# Patient Record
Sex: Female | Born: 1990
Health system: Southern US, Community
[De-identification: ages and names within clinical notes are randomized; demographics above are authoritative.]

## PROBLEM LIST (undated history)

## (undated) DIAGNOSIS — B019 Varicella without complication: Secondary | ICD-10-CM

## (undated) DIAGNOSIS — F329 Major depressive disorder, single episode, unspecified: Secondary | ICD-10-CM

## (undated) DIAGNOSIS — F419 Anxiety disorder, unspecified: Secondary | ICD-10-CM

## (undated) DIAGNOSIS — I498 Other specified cardiac arrhythmias: Secondary | ICD-10-CM

## (undated) DIAGNOSIS — I951 Orthostatic hypotension: Principal | ICD-10-CM

## (undated) DIAGNOSIS — G90A Postural orthostatic tachycardia syndrome (POTS): Secondary | ICD-10-CM

## (undated) DIAGNOSIS — F32A Depression, unspecified: Secondary | ICD-10-CM

## (undated) DIAGNOSIS — R000 Tachycardia, unspecified: Principal | ICD-10-CM

## (undated) DIAGNOSIS — R55 Syncope and collapse: Secondary | ICD-10-CM

## (undated) DIAGNOSIS — I10 Essential (primary) hypertension: Secondary | ICD-10-CM

## (undated) DIAGNOSIS — T7840XA Allergy, unspecified, initial encounter: Secondary | ICD-10-CM

## (undated) HISTORY — DX: Postural orthostatic tachycardia syndrome (POTS): G90.A

## (undated) HISTORY — DX: Essential (primary) hypertension: I10

## (undated) HISTORY — DX: Allergy, unspecified, initial encounter: T78.40XA

## (undated) HISTORY — PX: NO PAST SURGERIES: SHX2092

## (undated) HISTORY — DX: Depression, unspecified: F32.A

## (undated) HISTORY — DX: Major depressive disorder, single episode, unspecified: F32.9

## (undated) HISTORY — DX: Orthostatic hypotension: I95.1

## (undated) HISTORY — DX: Syncope and collapse: R55

## (undated) HISTORY — DX: Tachycardia, unspecified: R00.0

## (undated) HISTORY — DX: Varicella without complication: B01.9

## (undated) HISTORY — DX: Anxiety disorder, unspecified: F41.9

## (undated) HISTORY — DX: Other specified cardiac arrhythmias: I49.8

---
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
## 2011-11-26 ENCOUNTER — Encounter: Payer: Self-pay | Admitting: Internal Medicine

## 2011-11-26 ENCOUNTER — Ambulatory Visit (INDEPENDENT_AMBULATORY_CARE_PROVIDER_SITE_OTHER): Payer: BC Managed Care – PPO | Admitting: Internal Medicine

## 2011-11-26 VITALS — BP 126/86 | HR 97 | Temp 98.2°F | Ht 63.0 in | Wt 283.5 lb

## 2011-11-26 DIAGNOSIS — F411 Generalized anxiety disorder: Secondary | ICD-10-CM

## 2011-11-26 DIAGNOSIS — E282 Polycystic ovarian syndrome: Secondary | ICD-10-CM

## 2011-11-26 DIAGNOSIS — E669 Obesity, unspecified: Secondary | ICD-10-CM

## 2011-11-26 DIAGNOSIS — F418 Other specified anxiety disorders: Secondary | ICD-10-CM

## 2011-11-26 MED ORDER — SERTRALINE HCL 50 MG PO TABS: 50.0000 mg | ORAL_TABLET | Freq: Every day | ORAL | Status: DC

## 2011-11-26 NOTE — Patient Instructions (Addendum)
I am starting you on generic zoloft to help your anxiety. Start with 1/2 tablet daily with dinner,  increase to full tablet after a week.   Please start taking folic acid 1 mg daily.   Return at your leisure for fasting labs ( no food or drink but water for 8 hours prior)  Goal is 20 minutes, 3 times weekly;  eventually 30 minutes 5 days a week.    This is  Dr. Norton Blizzard version of a  "Low GI"  Diet:  All of the foods can be found at grocery stores and in bulk at [employer].  The Atkins protein bars and shakes are available in more varieties at Target, [employer] and [employer].     7 AM Breakfast:  Low carbohydrate Protein  Shakes (I recommend the EAS AdvantEdge "Carb Control" shakes  Or the low carb shakes by Atkins.   Both are available everywhere:  In  cases at BJs  Or in 4 packs at grocery stores and pharmacies  2.5 carbs  (Alternative is  a toasted Arnold's Sandwhich Thin w/ peanut butter, a "Bagel Thin" with cream cheese and salmon) or  a scrambled egg burrito made with a low carb tortilla .  Avoid cereal and bananas, oatmeal too unless you are cooking the old fashioned kind that takes 30-40 minutes to prepare.  the rest is overly processed, has minimal fiber, and is loaded with carbohydrates!     10 AM: Protein bar by Atkins (the snack size, under 200 cal).  There are many varieties , available widely again or in bulk in limited varieties at BJs)  Other so called "protein bars" tend to be loaded with carbohydrates.  Remember, in food advertising, the word "energy" is synonymous for " carbohydrate."  Lunch: sandwich of [place], (or any lunchmeat, grilled meat or canned tuna), fresh avocado, mayonnaise  and cheese on a lower carbohydrate pita bread, flatbread, or tortilla . Ok to use regular mayonnaise. The bread is the only source or carbohydrate that can be decreased (Joseph's makes a pita bread and a flat bread that are 50 cal and 4 net carbs ; Toufayan makes a low carb flatbread that's 100  cal and 9 net carbs  and  Mission makes a low carb whole wheat tortilla  That is 210 cal and 6 net carbs)  3 PM:  Mid day :  Another protein bar,  Or a  cheese stick (100 cal, 0 carbs),  Or 1 ounce of  almonds, walnuts, pistachios, pecans, peanuts,  Macadamia nuts. Or a Dannon light n Fit greek yogurt, 80 cal 8 net carbs . Avoid "granola"; the dried cranberries and raisins are loaded with carbohydrates. Mixed nuts ok if no raisins or cranberries or dried fruit.      6 PM  Dinner:  "mean and green:"  Meat/chicken/fish or a high protein legume; , with a green salad, and a low GI  Veggie (broccoli, cauliflower, green beans, spinach, brussel sprouts. Lima beans) : Avoid "Low fat dressings, as well as Reyne Dumas and [address]! They are loaded with sugar! Instead use ranch, vinagrette,  Blue cheese, etc  9 PM snack : Breyer's "low carb" fudgsicle or  ice cream bar (Carb Smart line), or  Weight Watcher's ice cream bar , or another "no sugar added" ice cream;a serving of fresh berries/cherries with whipped cream (Avoid bananas, pineapple, grapes  and watermelon on a regular basis because they are high in sugar)   Remember that snack Substitutions  should be less than 15 to 20 carbs  Per serving. Remember to subtract fiber grams and sugar alcohols to get the "net carbs."

## 2011-11-27 ENCOUNTER — Encounter: Payer: Self-pay | Admitting: Internal Medicine

## 2011-11-27 DIAGNOSIS — E282 Polycystic ovarian syndrome: Secondary | ICD-10-CM | POA: Insufficient documentation

## 2011-11-27 DIAGNOSIS — E669 Obesity, unspecified: Secondary | ICD-10-CM | POA: Insufficient documentation

## 2011-11-27 DIAGNOSIS — F418 Other specified anxiety disorders: Secondary | ICD-10-CM | POA: Insufficient documentation

## 2011-11-27 NOTE — Assessment & Plan Note (Signed)
I have addressed  BMI and recommended a low glycemic index diet utilizing smaller more frequent meals to increase metabolism.  I have also recommended that patient start exercising with a goal of 30 minutes of aerobic exercise a minimum of 5 days per week. Screening for lipid disorders, thyroid and diabetes to be done today.   

## 2011-11-27 NOTE — Assessment & Plan Note (Signed)
Discussed trial of SSRI, Zoloft, generic, for initiation of therapy. She will return in one month.

## 2011-11-27 NOTE — Progress Notes (Signed)
Patient ID: Julie Hammond, female   DOB: [birth year], 21 y.o.   MRN: [id number] Patient Active Problem List  Diagnosis  . Obesity (BMI 30-39.9)  . Polycystic ovarian syndrome  . Anxiety with obsessional features    Subjective:  CC:   Chief Complaint  Patient presents with  . Establish Care    HPI:   Julie Hammond is a 21 y.o. female who presents as a new patient to establish primary care with the multiple complaints including Obesity with polycystic ovarian syndrome,  and anxiety accompanied by obsessive-compulsive behaviors.  She is a 21 year old white female referred by her mother for primary care. Patient recently relocated from [place] back to [place] and is living with her parents. She was diagnosed with polycystic ovarian syndrome by an primary care provider/gynecologist several years ago in [place]. She has not lost any weight or has been and has not been placed on medications except for birth control which has regulated her periods. She has been overweight her entire life. She has not made any formal attempts to lose weight. She does not exercise regularly. 2) The history of anxiety with obsessive-compulsive behaviors was brought up by her mother. Patient would not elaborate on her particular behaviors were treated them  but does agree that she has read about  OCD and prescriptions as her behaviors.     Past Medical History  Diagnosis Date  . Chicken pox   . Allergy     History reviewed. No pertinent past surgical history.  Family History  Problem Relation Age of Onset  . Obesity Mother   . Diabetes Father   . Obesity Father     History   Social History  . Marital Status: Single    Spouse Name: N/A    Number of Children: N/A  . Years of Education: N/A   Occupational History  . Not on file.   Social History Main Topics  . Smoking status: Never Smoker   . Smokeless tobacco: Never Used  . Alcohol Use: No  . Drug Use: No  . Sexually Active:  Not Currently    Birth Control/ Protection: Pill   Other Topics Concern  . Not on file   Social History Narrative  . No narrative on file         @ALLHX @    Review of Systems:   The remainder of the review of systems was negative except those addressed in the HPI.       Objective:  BP 126/86  Pulse 97  Temp 98.2 F (36.8 C)  Ht 5\' 3"  (1.6 m)  Wt 283 lb 8 oz (128.595 kg)  BMI 50.22 kg/m2  SpO2 99%  LMP 11/20/2011  General appearance: alert, obese. cooperative and appears stated age Neck: no adenopathy, no carotid bruit, supple, symmetrical, trachea midline and thyroid not enlarged, symmetric, no tenderness/mass/nodules Back: symmetric, no curvature. ROM normal. No CVA tenderness. Lungs: clear to auscultation bilaterally Heart: regular rate and rhythm, S1, S2 normal, no murmur, click, rub or gallop Abdomen: soft, non-tender; bowel sounds normal; no masses,  no organomegaly Pulses: 2+ and symmetric Skin: Skin color, texture, turgor normal. No rashes or lesions Lymph nodes: Cervical, supraclavicular, and axillary nodes normal.  Assessment and Plan:  Anxiety with obsessional features Discussed trial of SSRI, Zoloft, generic, for initiation of therapy. She will return in one month.  Polycystic ovarian syndrome Diagnosed by her former PCP. She is taking birth control for regulation of her periods. We discussed adding medication such as  metformin but would like to get some baseline blood work first. Low glycemic index diet given to patient and we'll and mother. Her entire family is overweight and all are interested in participating in his diet which I have recommended strongly for success.  Obesity (BMI 30-39.9) I have addressed  BMI and recommended a low glycemic index diet utilizing smaller more frequent meals to increase metabolism.  I have also recommended that patient start exercising with a goal of 30 minutes of aerobic exercise a minimum of 5 days per week.  Screening for lipid disorders, thyroid and diabetes to be done today.     Updated Medication List Outpatient Encounter Prescriptions as of 11/26/2011  Medication Sig Dispense Refill  . sertraline (ZOLOFT) 50 MG tablet Take 1 tablet (50 mg total) by mouth daily.  30 tablet  3     Orders Placed This Encounter  Procedures  . HM PAP SMEAR  . Lipid panel  . Comprehensive metabolic panel  . TSH    No Follow-up on file.

## 2011-11-27 NOTE — Assessment & Plan Note (Signed)
Diagnosed by her former PCP. She is taking birth control for regulation of her periods. We discussed adding medication such as metformin but would like to get some baseline blood work first. Low glycemic index diet given to patient and we'll and mother. Her entire family is overweight and all are interested in participating in his diet which I have recommended strongly for success.

## 2011-11-28 ENCOUNTER — Other Ambulatory Visit (INDEPENDENT_AMBULATORY_CARE_PROVIDER_SITE_OTHER): Payer: BC Managed Care – PPO

## 2011-11-28 DIAGNOSIS — E282 Polycystic ovarian syndrome: Secondary | ICD-10-CM

## 2011-11-28 LAB — COMPREHENSIVE METABOLIC PANEL
Albumin: 3.5 g/dL (ref 3.5–5.2)
Alkaline Phosphatase: 36 U/L — ABNORMAL LOW (ref 39–117)
BUN: 11 mg/dL (ref 6–23)
CO2: 24 mEq/L (ref 19–32)
Calcium: 8.7 mg/dL (ref 8.4–10.5)
Chloride: 105 mEq/L (ref 96–112)
GFR: 121.16 mL/min (ref >60.00)
Glucose, Bld: 91 mg/dL (ref 70–99)
Potassium: 3.6 mEq/L (ref 3.5–5.1)
Sodium: 137 mEq/L (ref 135–145)
Total Protein: 7.1 g/dL (ref 6.0–8.3)

## 2011-11-28 LAB — LIPID PANEL: Cholesterol: 144 mg/dL (ref 0–200)

## 2011-11-28 LAB — TSH: TSH: 2.22 u[IU]/mL (ref 0.35–5.50)

## 2011-11-29 ENCOUNTER — Encounter: Payer: Self-pay | Admitting: Internal Medicine

## 2012-01-01 ENCOUNTER — Ambulatory Visit (INDEPENDENT_AMBULATORY_CARE_PROVIDER_SITE_OTHER): Payer: BC Managed Care – PPO | Admitting: Internal Medicine

## 2012-01-01 ENCOUNTER — Encounter: Payer: Self-pay | Admitting: Internal Medicine

## 2012-01-01 VITALS — BP 126/78 | HR 97 | Temp 98.3°F | Ht 63.0 in | Wt 291.0 lb

## 2012-01-01 DIAGNOSIS — F418 Other specified anxiety disorders: Secondary | ICD-10-CM

## 2012-01-01 DIAGNOSIS — F411 Generalized anxiety disorder: Secondary | ICD-10-CM

## 2012-01-01 DIAGNOSIS — E669 Obesity, unspecified: Secondary | ICD-10-CM

## 2012-01-01 NOTE — Progress Notes (Signed)
Patient ID: Julie Hammond, female   DOB: [birth year], 21 y.o.   MRN: [id number]  Patient Active Problem List  Diagnosis  . Obesity (BMI 30-39.9)  . Polycystic ovarian syndrome  . Anxiety with obsessional features    Subjective:  CC:   No chief complaint on file.   HPI:   Edgardo Roys Feitchtingeris a 21 y.o. female who presents Follow up on anxiety with obsessional features.  One month ago he started a trial of Zoloft for management o symptoms patient returns today with improvement in symptoms. She states that she is concentrating better. She she notes an improvement in her obsessive-compulsive behaviors and is experiencing less anxiety . Her family has noticed an improvement as well. She did have mild nausea for the first week but this has resolved. She has no other complaints today.  Review vital signs is notable for a weight gain 3 pounds. She is not exercising or following a calorie or carbohydrate restricted diet .     Past Medical History  Diagnosis Date  . Chicken pox   . Allergy     History reviewed. No pertinent past surgical history.       The following portions of the patient's history were reviewed and updated as appropriate: Allergies, current medications, and problem list.    Review of Systems:   12 Pt  review of systems was negative except those addressed in the HPI,     History   Social History  . Marital Status: Single    Spouse Name: N/A    Number of Children: N/A  . Years of Education: N/A   Occupational History  . Not on file.   Social History Main Topics  . Smoking status: Never Smoker   . Smokeless tobacco: Never Used  . Alcohol Use: No  . Drug Use: No  . Sexually Active: Not Currently    Birth Control/ Protection: Pill   Other Topics Concern  . Not on file   Social History Narrative  . No narrative on file    Objective:  BP 126/78  Pulse 97  Temp 98.3 F (36.8 C)  Ht 5\' 3"  (1.6 m)  Wt 291 lb (131.997 kg)  BMI 51.55  kg/m2  SpO2 97%  LMP 11/25/2011  General appearance: alert, cooperative and appears stated age Ears: normal TM's and external ear canals both ears Throat: lips, mucosa, and tongue normal; teeth and gums normal Neck: no adenopathy, no carotid bruit, supple, symmetrical, trachea midline and thyroid not enlarged, symmetric, no tenderness/mass/nodules Back: symmetric, no curvature. ROM normal. No CVA tenderness. Lungs: clear to auscultation bilaterally Heart: regular rate and rhythm, S1, S2 normal, no murmur, click, rub or gallop Abdomen: soft, non-tender; bowel sounds normal; no masses,  no organomegaly Pulses: 2+ and symmetric Skin: Skin color, texture, turgor normal. No rashes or lesions Lymph nodes: Cervical, supraclavicular, and axillary nodes normal.  Assessment and Plan:  Anxiety with obsessional features Symptoms have improved since starting a trial of Zoloft. She would like to state the current dose of 50 mg daily. No changes today. Return in 3 months.  Obesity (BMI 30-39.9) Secondary to sedentary lifestyle and overeating. She has no signs of diabetes or thyroid disorder by recent labs. She has gained 3 pounds since she started Zoloft. I strongly recommended that she begin an exercise program which will help her anxiety as well. Low carbohydrate diet printout has been given to her.   Updated Medication List Outpatient Encounter Prescriptions as of 01/01/2012  Medication Sig Dispense  Refill  . sertraline (ZOLOFT) 50 MG tablet Take 1 tablet (50 mg total) by mouth daily.  30 tablet  3     No orders of the defined types were placed in this encounter.    No Follow-up on file.

## 2012-01-01 NOTE — Patient Instructions (Addendum)
Return in 3 months.   This is  Dr. Norton Blizzard version of a  "Low GI"  Diet:  All of the foods can be found at grocery stores and in bulk at [employer].  The Atkins protein bars and shakes are available in more varieties at Target, [employer] and [employer].     7 AM Breakfast:  Low carbohydrate Protein  Shakes (I recommend the EAS AdvantEdge "Carb Control" shakes  Or the low carb shakes by Atkins.   Both are available everywhere:  In  cases at [employer]Js  Or in 4 packs at grocery stores and pharmacies  2.5 carbs  (Alternative is  a toasted Arnold's Sandwhich Thin w/ peanut butter, a "Bagel Thin" with cream cheese and salmon) or  a scrambled egg burrito made with a low carb tortilla .  Avoid cereal and bananas, oatmeal too unless you are cooking the old fashioned kind that takes 30-40 minutes to prepare.  the rest is overly processed, has minimal fiber, and is loaded with carbohydrates!   10 AM: Protein bar by Atkins (the snack size, under 200 cal).  There are many varieties , available widely again or in bulk in limited varieties at [employer]Js)  Other so called "protein bars" tend to be loaded with carbohydrates.  Remember, in food advertising, the word "energy" is synonymous for " carbohydrate."  Lunch: sandwich of [place], (or any lunchmeat, grilled meat or canned tuna), fresh avocado, mayonnaise  and cheese on a lower carbohydrate pita bread, flatbread, or tortilla . Ok to use regular mayonnaise. The bread is the only source or carbohydrate that can be decreased (Joseph's makes a pita bread and a flat bread that are 50 cal and 4 net carbs ; Toufayan makes a low carb flatbread that's 100 cal and 9 net carbs  and  [employer]ission makes a low carb whole wheat tortilla  That is 210 cal and 6 net carbs)  3 PM:  Mid day :  Another protein bar,  Or a  cheese stick (100 cal, 0 carbs),  Or 1 ounce of  almonds, walnuts, pistachios, pecans, peanuts,  Macadamia nuts. Or a Dannon light n Fit greek yogurt, 80 cal 8 net carbs . Avoid  "granola"; the dried cranberries and raisins are loaded with carbohydrates. Mixed nuts ok if no raisins or cranberries or dried fruit.      6 PM  Dinner:  "mean and green:"  Meat/chicken/fish or a high protein legume; , with a green salad, and a low GI  Veggie (broccoli, cauliflower, green beans, spinach, brussel sprouts. Lima beans) : Avoid "Low fat dressings, as well as Reyne Dumas and [address]! They are loaded with sugar! Instead use ranch, vinagrette,  Blue cheese, etc  9 PM snack : Breyer's "low carb" fudgsicle or  ice cream bar (Carb Smart line), or  Weight Watcher's ice cream bar , or another "no sugar added" ice cream;a serving of fresh berries/cherries with whipped cream (Avoid bananas, pineapple, grapes  and watermelon on a regular basis because they are high in sugar)   Remember that snack Substitutions should be less than 15 to 20 carbs  Per serving. Remember to subtract fiber grams and sugar alcohols to get the "net carbs."

## 2012-01-04 ENCOUNTER — Encounter: Payer: Self-pay | Admitting: Internal Medicine

## 2012-01-04 NOTE — Assessment & Plan Note (Signed)
Symptoms have improved since starting a trial of Zoloft. She would like to state the current dose of 50 mg daily. No changes today. Return in 3 months.

## 2012-01-04 NOTE — Assessment & Plan Note (Signed)
Secondary to sedentary lifestyle and overeating. She has no signs of diabetes or thyroid disorder by recent labs. She has gained 3 pounds since she started Zoloft. I strongly recommended that she begin an exercise program which will help her anxiety as well. Low carbohydrate diet printout has been given to her.

## 2012-04-06 ENCOUNTER — Ambulatory Visit (INDEPENDENT_AMBULATORY_CARE_PROVIDER_SITE_OTHER): Payer: Managed Care, Other (non HMO) | Admitting: Internal Medicine

## 2012-04-06 ENCOUNTER — Encounter: Payer: Self-pay | Admitting: Internal Medicine

## 2012-04-06 VITALS — BP 126/86 | HR 100 | Temp 97.6°F | Resp 16 | Wt 295.2 lb

## 2012-04-06 DIAGNOSIS — F418 Other specified anxiety disorders: Secondary | ICD-10-CM

## 2012-04-06 DIAGNOSIS — F411 Generalized anxiety disorder: Secondary | ICD-10-CM

## 2012-04-06 DIAGNOSIS — E669 Obesity, unspecified: Secondary | ICD-10-CM

## 2012-04-06 MED ORDER — SERTRALINE HCL 50 MG PO TABS: 100.0000 mg | ORAL_TABLET | Freq: Every day | ORAL | Status: DC

## 2012-04-06 MED ORDER — SERTRALINE HCL 100 MG PO TABS: 100.0000 mg | ORAL_TABLET | Freq: Every day | ORAL | Status: DC

## 2012-04-06 NOTE — Patient Instructions (Addendum)
We are increasing your zoloft to 100 mg daily , you can use the 50's to take 75 mg daily for the first week if you notice nausea with the higher dose

## 2012-04-07 ENCOUNTER — Encounter: Payer: Self-pay | Admitting: Internal Medicine

## 2012-04-07 NOTE — Assessment & Plan Note (Signed)
I have addressed  BMI and recommended a low glycemic index diet utilizing smaller more frequent meals to increase metabolism.  I have also recommended that patient start exercising with a goal of 30 minutes of aerobic exercise a minimum of 5 days per week. Screening for lipid disorders, thyroid and diabetes to be done today.   

## 2012-04-07 NOTE — Assessment & Plan Note (Addendum)
Agree with increase in dose of zoloft to 100 mg daily .  She is introverted and avoids eye contact but likes to use testing and electronic media.  I have suggest that she use Mychart to let me know how the medication changes affect her symptoms,  Otherwise she will follow up in 3 months

## 2012-04-07 NOTE — Progress Notes (Signed)
Patient ID: Julie Hammond, female   DOB: [birth year], 21 y.o.   MRN: [id number]  Patient Active Problem List  Diagnosis  . Obesity (BMI 30-39.9)  . Polycystic ovarian syndrome  . Anxiety with obsessional features    Subjective:  CC:   Chief Complaint  Patient presents with  . Follow-up    HPI:   Julie Roys Feitchtingeris a 22 y.o. female who presents3 month follow up on generalized anxiety with obsessive compulsive traits.  Her symptoms improved initially on the starting dose of zoloft but she has noticed a trend toward reutrning to her old habits recently and is requesting an increase in her daily dose. She has no side effects from the medication, is sleeping well.Marland Kitchen   2) Obesity: her family has decided to start a  Walking program together along with a weight reduction dietary plan.she is motivated to follow through with the family's plan ,    Past Medical History  Diagnosis Date  . Chicken pox   . Allergy     History reviewed. No pertinent past surgical history.       The following portions of the patient's history were reviewed and updated as appropriate: Allergies, current medications, and problem list.    Review of Systems:  Patient denies headache, fevers, malaise, unintentional weight loss, skin rash, eye pain, sinus congestion and sinus pain, sore throat, dysphagia,  hemoptysis , cough, dyspnea, wheezing, chest pain, palpitations, orthopnea, edema, abdominal pain, nausea, melena, diarrhea, constipation, flank pain, dysuria, hematuria, urinary  Frequency, nocturia, numbness, tingling, seizures,  Focal weakness, Loss of consciousness,  Tremor, insomnia, depression, anxiety, and suicidal ideation.         History   Social History  . Marital Status: Single    Spouse Name: N/A    Number of Children: N/A  . Years of Education: N/A   Occupational History  . Not on file.   Social History Main Topics  . Smoking status: Never Smoker   . Smokeless tobacco:  Never Used  . Alcohol Use: No  . Drug Use: No  . Sexually Active: Not Currently    Birth Control/ Protection: Pill   Other Topics Concern  . Not on file   Social History Narrative  . No narrative on file    Objective:  BP 126/86  Pulse 100  Temp 97.6 F (36.4 C) (Oral)  Resp 16  Wt 295 lb 4 oz (133.925 kg)  SpO2 97%  LMP 03/06/2012  General appearance: morbidly obese, alert, cooperative and appears stated age Ears: normal TM's and external ear canals both ears Throat: lips, mucosa, and tongue normal; teeth and gums normal Neck: no adenopathy, no carotid bruit, supple, symmetrical, trachea midline and thyroid not enlarged, symmetric, no tenderness/mass/nodules Back: symmetric, no curvature. ROM normal. No CVA tenderness. Lungs: clear to auscultation bilaterally Heart: regular rate and rhythm, S1, S2 normal, no murmur, click, rub or gallop Abdomen: soft, non-tender; bowel sounds normal; no masses,  no organomegaly Pulses: 2+ and symmetric Skin: pustular acne, Lymph nodes: Cervical, supraclavicular, and axillary nodes normal.  Assessment and Plan:  Anxiety with obsessional features Agree with increase in dose of zoloft to 100 mg daily .  She is introverted and avoids eye contact but likes to use testing and electronic media.  I have suggest that she use Mychart to let me know how the medication changes affect her symptoms,  Otherwise she will follow up in 3 months   Obesity (BMI 30-39.9) I have addressed  BMI and recommended a  low glycemic index diet utilizing smaller more frequent meals to increase metabolism.  I have also recommended that patient start exercising with a goal of 30 minutes of aerobic exercise a minimum of 5 days per week. Screening for lipid disorders, thyroid and diabetes to be done today.     Updated Medication List Outpatient Encounter Prescriptions as of 04/06/2012  Medication Sig Dispense Refill  . sertraline (ZOLOFT) 100 MG tablet Take 1 tablet  (100 mg total) by mouth daily.  30 tablet  3  . [DISCONTINUED] sertraline (ZOLOFT) 50 MG tablet Take 1 tablet (50 mg total) by mouth daily.  30 tablet  3  . [DISCONTINUED] sertraline (ZOLOFT) 50 MG tablet Take 2 tablets (100 mg total) by mouth daily.  30 tablet  3     No orders of the defined types were placed in this encounter.    Return in about 3 months (around 07/05/2012).

## 2012-07-08 ENCOUNTER — Ambulatory Visit: Payer: Managed Care, Other (non HMO) | Admitting: Internal Medicine

## 2012-07-09 ENCOUNTER — Encounter: Payer: Self-pay | Admitting: Internal Medicine

## 2012-07-09 ENCOUNTER — Ambulatory Visit (INDEPENDENT_AMBULATORY_CARE_PROVIDER_SITE_OTHER): Payer: Managed Care, Other (non HMO) | Admitting: Internal Medicine

## 2012-07-09 VITALS — BP 110/80 | HR 98 | Temp 98.1°F | Resp 14 | Wt 299.5 lb

## 2012-07-09 DIAGNOSIS — F418 Other specified anxiety disorders: Secondary | ICD-10-CM

## 2012-07-09 DIAGNOSIS — E669 Obesity, unspecified: Secondary | ICD-10-CM

## 2012-07-09 DIAGNOSIS — B070 Plantar wart: Secondary | ICD-10-CM

## 2012-07-09 DIAGNOSIS — F411 Generalized anxiety disorder: Secondary | ICD-10-CM

## 2012-07-09 MED ORDER — IBUPROFEN 800 MG PO TABS: 800.0000 mg | ORAL_TABLET | Freq: Three times a day (TID) | ORAL | Status: DC | PRN

## 2012-07-09 MED ORDER — SERTRALINE HCL 100 MG PO TABS: 100.0000 mg | ORAL_TABLET | Freq: Every day | ORAL | Status: DC

## 2012-07-09 NOTE — Progress Notes (Signed)
Patient ID: Julie Hammond, female   DOB: 06-Jul-1990, 21 y.o.   MRN: [id number]  Patient Active Problem List   Diagnosis Date Noted  . Plantar warts 07/12/2012  . Obesity (BMI 30-39.9) 11/27/2011  . Polycystic ovarian syndrome 11/27/2011  . Anxiety with obsessional features 11/27/2011    Subjective:  CC:   Chief Complaint  Patient presents with  . Follow-up    3 month    HPI:   Julie Feitchtingeris a 22 y.o. female who presents Follow up on OCD and obesity.  3 months ago zoloft dose was increased for return of symptoms.  Havingi Increased  sleep but  She reports morning grogginess for less than 15 minutes.  She is averaging 6 to 9 hours  Per night.   2) Obesity:  BMI now 52,  Has actually gained 4 lbs ince last visit and 16 lbs total since sept .  At her last visit she was going to start a walking program with her family , but only followed through for a month before quitting.  No particular reason.  Denies joint pain.  3) Plantar warts on right foot 1str and second toes . 4 warts total  requesting treatment     Past Medical History  Diagnosis Date  . Chicken pox   . Allergy     History reviewed. No pertinent past surgical history.     The following portions of the patient's history were reviewed and updated as appropriate: Allergies, current medications, and problem list.    Review of Systems:   Patient denies headache, fevers, malaise, unintentional weight loss, skin rash, eye pain, sinus congestion and sinus pain, sore throat, dysphagia,  hemoptysis , cough, dyspnea, wheezing, chest pain, palpitations, orthopnea, edema, abdominal pain, nausea, melena, diarrhea, constipation, flank pain, dysuria, hematuria, urinary  Frequency, nocturia, numbness, tingling, seizures,  Focal weakness, Loss of consciousness,  Tremor, insomnia, depression, anxiety, and suicidal ideation.     History   Social History  . Marital Status: Single    Spouse Name: N/A    Number of  Children: N/A  . Years of Education: N/A   Occupational History  . Not on file.   Social History Main Topics  . Smoking status: Never Smoker   . Smokeless tobacco: Never Used  . Alcohol Use: No  . Drug Use: No  . Sexually Active: Not Currently    Birth Control/ Protection: Pill   Other Topics Concern  . Not on file   Social History Narrative  . No narrative on file    Objective:  BP 110/80  Pulse 98  Temp(Src) 98.1 F (36.7 C) (Oral)  Resp 14  Wt 299 lb 8 oz (135.852 kg)  BMI 53.07 kg/m2  SpO2 98%  LMP 07/05/2012  General appearance: alert, cooperative and appears stated age Ears: normal TM's and external ear canals both ears Throat: lips, mucosa, and tongue normal; teeth and gums normal Neck: no adenopathy, no carotid bruit, supple, symmetrical, trachea midline and thyroid not enlarged, symmetric, no tenderness/mass/nodules Back: symmetric, no curvature. ROM normal. No CVA tenderness. Lungs: clear to auscultation bilaterally Heart: regular rate and rhythm, S1, S2 normal, no murmur, click, rub or gallop Abdomen: soft, non-tender; bowel sounds normal; no masses,  no organomegaly Pulses: 2+ and symmetric Skin: Skin color, texture, turgor normal. No rashes or lesions Lymph nodes: Cervical, supraclavicular, and axillary nodes normal.  Assessment and Plan:  Obesity (BMI 30-39.9) I have addressed  BMI and recommended a low glycemic index diet utilizing smaller  more frequent meals to increase metabolism.  I have also recommended that patient start exercising with a goal of 30 minutes of aerobic exercise a minimum of 5 days per week.   Anxiety with obsessional features Controlled with current sertraline dose. No changes today   Plantar warts 4 warts frozen today per patienr request, one on great toe and three on 2nd toe.  Post proceudre care discussed,  Patient warned to expect blistering and redness.   Updated Medication List Outpatient Encounter Prescriptions as of  07/09/2012  Medication Sig Dispense Refill  . sertraline (ZOLOFT) 100 MG tablet Take 1 tablet (100 mg total) by mouth daily.  30 tablet  3  . [DISCONTINUED] sertraline (ZOLOFT) 100 MG tablet Take 1 tablet (100 mg total) by mouth daily.  30 tablet  3  . ibuprofen (ADVIL,MOTRIN) 800 MG tablet Take 1 tablet (800 mg total) by mouth every 8 (eight) hours as needed for pain.  30 tablet  0   No facility-administered encounter medications on file as of 07/09/2012.     No orders of the defined types were placed in this encounter.    Return in about 3 months (around 10/09/2012).

## 2012-07-09 NOTE — Patient Instructions (Addendum)
Ibuprofen 800 mg and tylenol 500 mg every 8 hours as needed for pain    I want you to lose 10%  Of your current body weight over the next 6 months . With diet and exercise (30 minutes of walking a minimum of 5 days per week)  This is  One version of a  "Low GI"  Diet:  It will still lower your blood sugars and allow you to lose 4 to 8  lbs  per month if you follow it carefully.  Your goal with exercise is a minimum of 30 minutes of aerobic exercise 5 days per week (Walking does not count once it becomes easy!)    All of the foods can be found at grocery stores and in bulk at [employer].  The Atkins protein bars and shakes are available in more varieties at Target, WalMart and [employer] Foods.     7 AM Breakfast:  Choose from the following:  Low carbohydrate Protein  Shakes (I recommend the EAS AdvantEdge "Carb Control" shakes  Or the low carb shakes by Atkins.    2.5 carbs   Arnold's "Sandwhich Thin"toasted  w/ peanut butter (no jelly: about 20 net carbs  "Bagel Thin" with cream cheese and salmon: about 20 carbs   a scrambled egg/bacon/cheese burrito made with Mission's "carb balance" whole wheat tortilla  (about 10 net carbs )   Avoid cereal and bananas, oatmeal and cream of wheat and grits. They are loaded with carbohydrates!   10 AM: high protein snack  Protein bar by Atkins (the snack size, under 200 cal, usually < 6 net carbs).    A stick of cheese:  Around 1 carb,  100 cal     Dannon Light n Fit [place] Yogurt  (80 cal, 8 carbs)  Other so called "protein bars" and Greek yogurts tend to be loaded with carbohydrates.  Remember, in food advertising, the word "energy" is synonymous for " carbohydrate."  Lunch:   A Sandwich using the bread choices listed, Can use any  Eggs,  lunchmeat, grilled meat or canned tuna), avocado, regular mayo/mustard  and cheese.  A Salad using blue cheese, ranch,  Goddess or vinagrette,  No croutons or "confetti" and no "candied nuts" but regular nuts OK.   No  pretzels or chips.  Pickles and miniature sweet peppers are a good low carb alternative that provide a "crunch"  The bread is the only source of carbohydrate in a sandwich and  can be decreased by trying some of these alternatives to traditional loaf bread  Joseph's makes a pita bread and a flat bread that are 50 cal and 4 net carbs available at BJs and WalMart.  This can be toasted to use with hummous as well  Toufayan makes a low carb flatbread that's 100 cal and 9 net carbs available at [employer] and [employer] makes 2 sizes of  Low carb whole wheat tortilla  (The large one is 210 cal and 6 net carbs) Avoid "Low fat dressings, as well as Reyne Dumas and [address] dressings They are loaded with sugar!   3 PM/ Mid day  Snack:  Consider  1 ounce of  almonds, walnuts, pistachios, pecans, peanuts,  Macadamia nuts or a nut medley.  Avoid "granola"; the dried cranberries and raisins are loaded with carbohydrates. Mixed nuts as long as there are no raisins,  cranberries or dried fruit.     6 PM  Dinner:     Meat/fowl/fish with  a green salad, and either broccoli, cauliflower, green beans, spinach, brussel sprouts or  Lima beans. DO NOT BREAD THE PROTEIN!!      There is a low carb pasta by Dreamfield's that is acceptable and tastes great: only 5 digestible carbs/serving.( All grocery stores but BJs carry it )  Try Kai Levins Angelo's chicken piccata or chicken or eggplant parm over low carb pasta.(Lowes and BJs)   Clifton Custard Sanchez's "Carnitas" (pulled pork, no sauce,  0 carbs) or his beef pot roast to make a dinner burrito (at BJ's)  Pesto over low carb pasta (bj's sells a good quality pesto in the center refrigerated section of the deli   Whole wheat pasta is still full of digestible carbs and  Not as low in glycemic index as Dreamfield's.   Brown rice is still rice,  So skip the rice and noodles if you eat [place] or [place] (or at least limit to 1/2 cup)  9 PM snack :   Breyer's "low carb" fudgsicle  or  ice cream bar (Carb Smart line), or  Weight Watcher's ice cream bar , or another "no sugar added" ice cream;  a serving of fresh berries/cherries with whipped cream   Cheese or DANNON'S LlGHT N FIT [place] YOGURT  Avoid bananas, pineapple, grapes  and watermelon on a regular basis because they are high in sugar.  THINK OF THEM AS DESSERT  Remember that snack Substitutions should be less than 10 NET carbs per serving and meals < 20 carbs. Remember to subtract fiber grams to get the "net carbs."

## 2012-07-12 ENCOUNTER — Encounter: Payer: Self-pay | Admitting: Internal Medicine

## 2012-07-12 DIAGNOSIS — B070 Plantar wart: Secondary | ICD-10-CM | POA: Insufficient documentation

## 2012-07-12 NOTE — Assessment & Plan Note (Signed)
Controlled with current sertraline dose. No changes today

## 2012-07-12 NOTE — Assessment & Plan Note (Signed)
I have addressed  BMI and recommended a low glycemic index diet utilizing smaller more frequent meals to increase metabolism.  I have also recommended that patient start exercising with a goal of 30 minutes of aerobic exercise a minimum of 5 days per week.  

## 2012-07-12 NOTE — Assessment & Plan Note (Signed)
4 warts frozen today per patienr request, one on great toe and three on 2nd toe.  Post proceudre care discussed,  Patient warned to expect blistering and redness.

## 2012-08-24 ENCOUNTER — Telehealth: Payer: Self-pay | Admitting: *Deleted

## 2012-08-24 DIAGNOSIS — Z8249 Family history of ischemic heart disease and other diseases of the circulatory system: Secondary | ICD-10-CM

## 2012-08-24 NOTE — Telephone Encounter (Signed)
Referral is in process as requested 

## 2012-08-24 NOTE — Telephone Encounter (Signed)
Patient sister has been diagnosed with Long QT syndrome and patient mother would like referral so patient can have cardiologist evaluate patient for same syndrome.

## 2012-08-25 NOTE — Telephone Encounter (Signed)
Patient notified

## 2012-08-27 ENCOUNTER — Encounter: Payer: Self-pay | Admitting: Cardiovascular Disease

## 2012-08-27 ENCOUNTER — Ambulatory Visit (INDEPENDENT_AMBULATORY_CARE_PROVIDER_SITE_OTHER): Payer: Managed Care, Other (non HMO) | Admitting: Cardiovascular Disease

## 2012-08-27 VITALS — BP 108/84 | HR 124 | Ht 64.0 in | Wt 297.0 lb

## 2012-08-27 DIAGNOSIS — I951 Orthostatic hypotension: Secondary | ICD-10-CM

## 2012-08-27 DIAGNOSIS — I498 Other specified cardiac arrhythmias: Secondary | ICD-10-CM | POA: Insufficient documentation

## 2012-08-27 DIAGNOSIS — R0602 Shortness of breath: Secondary | ICD-10-CM

## 2012-08-27 DIAGNOSIS — R55 Syncope and collapse: Secondary | ICD-10-CM | POA: Insufficient documentation

## 2012-08-27 DIAGNOSIS — R000 Tachycardia, unspecified: Secondary | ICD-10-CM

## 2012-08-27 DIAGNOSIS — R002 Palpitations: Secondary | ICD-10-CM

## 2012-08-27 DIAGNOSIS — Z8249 Family history of ischemic heart disease and other diseases of the circulatory system: Secondary | ICD-10-CM | POA: Insufficient documentation

## 2012-08-27 DIAGNOSIS — Z8679 Personal history of other diseases of the circulatory system: Secondary | ICD-10-CM

## 2012-08-27 NOTE — Assessment & Plan Note (Signed)
Notable drop in her blood pressure with accelerated heart rate on standing, heart rate up to 137 which is a 40 point increase in her heart rate with change in position. She was relatively asymptomatic. She has had episodes of syncope in the past. We have offered low-dose atenolol, encouraged fluid loading, even salt loading for blood pressure. Could also consider compression hose if she becomes symptomatic. He would like to hold off on medications at this time and discuss additional workup for possible prolonged QT with lebuer EP.  We'll hold off on any additional studies including echocardiogram or Holter at this time.

## 2012-08-27 NOTE — Assessment & Plan Note (Addendum)
Mother reports other daughter has prolonged QT, variable. By her report, this was difficult to induce and was done at [facility] under the guidance of EP. Require treadmill and hyperventilation to induce. He would like to schedule an appointment with EP to determine if [facility] has prolonged QT syndrome. Further testing could be done under the guidance of EP. We'll hold off on Holter and echo. EKG is essentially benign apart from tachycardia. Clinical exam is essentially benign apart from obesity. The mother and this patient will try to arrange appointment at the end of the month with Dr. Graciela Husbands.

## 2012-08-27 NOTE — Patient Instructions (Addendum)
You are doing well. No medication changes were made.  Please monitor your heart rate over the next few weeks at rest and with activity We will arrange follow up with Dr. Graciela Husbands this month  Please call us if you have new issues that need to be addressed before your next appt.

## 2012-08-27 NOTE — Progress Notes (Signed)
Patient ID: Julie Hammond, female    DOB: 03-26-1990, 22 y.o.   MRN: [id number]  HPI Comments: [name] is a 22 year old woman, patient of Dr. Darrick Huntsman who presents with her mother for evaluation of possible prolonged QT syndrome.  notes indicate a history of tachycardia and syncope.  The patient's mother reports that her other daughter was recently diagnosed with variable prolonged QT syndrome. This workup was extensive, done at [facility]. 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 He had difficult time increasing her prolonged QT and possible arrhythmia. By the mother's report, it sounds as if this was induced on a treadmill, and with hyperventilation.   The patient presenting today he has had episodes of syncope in the past. One episode was 4 years ago after she got up from a sitting position to a standing position and walking to the kitchen. She had syncope and woke up on the floor. She does not remember much 20 minutes prior to the event or 20 minutes after the event to this day.   Second episode of syncope occurred while she was visiting a family member in the hospital. She went to the bathroom in the hospital, came out of the bathroom and had syncope.   She's hours had low blood pressures, some problems with heart rates in the past. She has not been tracking this to any significant degree and does not check her blood pressure. Recently she has been asymptomatic, for obese many months.   Orthostatics done in the office today show blood pressure  supine 124/82, heart rate 97 Sitting 112/74, heart rate 93 Standing 109/72, heart rate 137 Followup standing blood pressures 108/80, heart rate 126 Repeat blood pressure standing 108/84, heart rate 124  EKG today shows normal sinus rhythm with rate 123 beats per minute, no significant ST or T wave changes, QTC 432 Additional EKGs were performed showing heart rate 120 with QTC 425 and QTC 403 with heart rate of  101    Outpatient Encounter Prescriptions as of 08/27/2012  Medication Sig Dispense Refill  . Multiple Vitamin (MULTIVITAMIN) tablet Take 1 tablet by mouth daily.      . sertraline (ZOLOFT) 100 MG tablet Take 1 tablet (100 mg total) by mouth daily.  30 tablet  3  . [DISCONTINUED] ibuprofen (ADVIL,MOTRIN) 800 MG tablet Take 1 tablet (800 mg total) by mouth every 8 (eight) hours as needed for pain.  30 tablet  0    Review of Systems  Constitutional: Negative.   HENT: Negative.   Eyes: Negative.   Respiratory: Negative.   Cardiovascular: Negative.   Gastrointestinal: Negative.   Musculoskeletal: Negative.   Skin: Negative.   Neurological: Negative.        Remote episode of syncope x2  Psychiatric/Behavioral: Negative.   All other systems reviewed and are negative.    BP 108/84  Pulse 124  Ht 5\' 4"  (1.626 m)  Wt 297 lb (134.718 kg)  BMI 50.95 kg/m2  Physical Exam  Nursing note and vitals reviewed. Constitutional: She is oriented to person, place, and time. She appears well-developed and well-nourished.  Obese  HENT:  Head: Normocephalic.  Nose: Nose normal.  Mouth/Throat: Oropharynx is clear and moist.  Eyes: Conjunctivae are normal. Pupils are equal, round, and reactive to light.  Neck: Normal range of motion. Neck supple. No JVD present.  Cardiovascular: Regular rhythm, S1 normal, S2 normal, normal heart sounds and intact distal pulses.  Tachycardia present.  Exam reveals no gallop and no  friction rub.   No murmur heard. Pulmonary/Chest: Effort normal and breath sounds normal. No respiratory distress. She has no wheezes. She has no rales. She exhibits no tenderness.  Abdominal: Soft. Bowel sounds are normal. She exhibits no distension. There is no tenderness.  Musculoskeletal: Normal range of motion. She exhibits no edema and no tenderness.  Lymphadenopathy:    She has no cervical adenopathy.  Neurological: She is alert and oriented to person, place, and time.  Coordination normal.  Skin: Skin is warm and dry. No rash noted. No erythema.  Psychiatric: She has a normal mood and affect. Her behavior is normal. Judgment and thought content normal.    Assessment and Plan

## 2012-08-27 NOTE — Assessment & Plan Note (Signed)
Episodes of syncope several years ago. Each seem to occur in the setting of Going from a sitting position to standing position which may be consistent with her drop in blood pressure and tachycardia seen today and possible orthostatic hypotension. Unable to exclude POTS.

## 2012-09-07 ENCOUNTER — Ambulatory Visit (INDEPENDENT_AMBULATORY_CARE_PROVIDER_SITE_OTHER): Payer: BC Managed Care – PPO | Admitting: Internal Medicine

## 2012-09-07 ENCOUNTER — Encounter: Payer: Self-pay | Admitting: Internal Medicine

## 2012-09-07 VITALS — BP 128/80 | HR 97 | Ht 63.0 in | Wt 301.5 lb

## 2012-09-07 DIAGNOSIS — R000 Tachycardia, unspecified: Secondary | ICD-10-CM

## 2012-09-07 DIAGNOSIS — I498 Other specified cardiac arrhythmias: Secondary | ICD-10-CM

## 2012-09-07 DIAGNOSIS — I951 Orthostatic hypotension: Secondary | ICD-10-CM

## 2012-09-07 DIAGNOSIS — R55 Syncope and collapse: Secondary | ICD-10-CM

## 2012-09-07 NOTE — Assessment & Plan Note (Signed)
The patient meets criteria for POTS, even with the expanded heart rate range associated with young women. Her symptoms also are very consistent. We discussed extensively the physiology and the importance of fluid repletion, trigger avoidance, and response to prodrome. I've given her and her mother the website information for POTS place.com and NeedCharge.es.  She and her mother both have similar symptoms. It begs the question as to whether there is a genetic disorder underlying this autonomic symptoms; this has been reported years ago.  I do not think the patient's symptoms relate to long QT syndrome. I think it would be important to undertake genetic testing if the probe was identified in the patient's sister.

## 2012-09-07 NOTE — Progress Notes (Signed)
ELECTROPHYSIOLOGY CONSULT NOTE  Patient ID: Julie Hammond, MRN: [id number], DOB/AGE: 1990-12-13 21 y.o. Admit date: (Not on file) Date of Consult: 09/07/2012  Primary Physician: Duncan Dull, MD Primary Cardiologist: TG  Chief Complaint: palpitations   HPI Julie Hammond is a 22 y.o. female  Seen with her mother after her sister was diagnosed with long QT syndrome by Dr. Rosiland Oz  The patient has a long-standing history of recurrent syncope with a stereotypical prodrome characterized by loss of vision, nausea, diaphoresis and flushing with recovery fatigue. Her syncopal episode occurred 4 years ago or so, one in the context of an illness and the other following a long drive to see her grandmother in the hospital  She has multiple episodes of presyncope with similar prodrome. She had lightheadedness with heat, standing, menses, shower, and she notes that her symptoms are worse in the morning. She has exercise intolerance and tachypalpitations. She is morbidly obese. She has had no peripheral edema  When she saw Dr. Knute Neu in the office 2 weeks ago, orthostatic vital signs were notable for a modest change in blood pressure, i.e. 124--108 ; with a significant change in heart rate from 93--137. Her diet is volume depleted.    Past Medical History  Diagnosis Date  . Chicken pox   . Allergy   . Syncope and collapse       Surgical History: History reviewed. No pertinent past surgical history.   Home Meds: Prior to Admission medications   Medication Sig Start Date End Date Taking? Authorizing Provider  Multiple Vitamin (MULTIVITAMIN) tablet Take 1 tablet by mouth daily.   Yes Historical Provider, MD  sertraline (ZOLOFT) 100 MG tablet Take 1 tablet (100 mg total) by mouth daily. 07/09/12  Yes Sherlene Shams, MD      Allergies: No Known Allergies  History   Social History  . Marital Status: Single    Spouse Name: N/A    Number of Children: N/A  . Years of Education:  N/A   Occupational History  . Not on file.   Social History Main Topics  . Smoking status: Never Smoker   . Smokeless tobacco: Never Used  . Alcohol Use: No  . Drug Use: No  . Sexually Active: Not Currently    Birth Control/ Protection: Pill   Other Topics Concern  . Not on file   Social History Narrative  . No narrative on file     Family History  Problem Relation Age of Onset  . Obesity Mother   . Diabetes Father   . Obesity Father      ROS:  Please see the history of present illness.     All other systems reviewed and negative.    Physical Exam: Blood pressure 128/80, pulse 97, height 5\' 3"  (1.6 m), weight 301 lb 8 oz (136.76 kg). General: Well developed, morbidly obese age appearing Caucasian  female in no acute distress. Head: Normocephalic, atraumatic, sclera non-icteric, no xanthomas, nares are without discharge. EENT: normal Lymph Nodes:  none Back: without scoliosis/kyphosis , no CVA tendersness Neck: Negative for carotid bruits. JVD not elevated. Lungs: Clear bilaterally to auscultation without wheezes, rales, or rhonchi. Breathing is unlabored. Heart: RRR with S1 S2. No*  murmur , rubs, or gallops appreciated. Abdomen: Soft, non-tender, non-distended with normoactive bowel sounds. No hepatomegaly. No rebound/guarding. No obvious abdominal masses. Msk:  Strength and tone appear normal for age. Extremities: No clubbing or cyanosis. No  edema.  Distal pedal pulses are 2+ and equal bilaterally.  Skin: Warm and Dry Neuro: Alert and oriented X 3. CN III-XII intact Grossly normal sensory and motor function . Psych:  Responds to questions appropriately with a normal affect.      Labs:  Radiology/Studies:  No results found.  EKG: sinus rhythm at 97 Intervals 14/09/33/QTC 40   Assessment and Plan:    Julie Hammond

## 2012-09-07 NOTE — Patient Instructions (Addendum)
Patient was given written Rx for Metoprolol Succinate 50 mg, Atenolol 50 mg, Bystolic 20 mg, Metoprolol Tartrate 50 mg to try to see which one will work best. Patient is to call office after trying medications and will let us know which works best.  Follow up with Dr. Graciela Husbands in August. Call office to schedule.

## 2012-10-06 ENCOUNTER — Telehealth: Payer: Self-pay

## 2012-10-06 MED ORDER — ATENOLOL 50 MG PO TABS: 50.0000 mg | ORAL_TABLET | Freq: Every day | ORAL | Status: DC

## 2012-10-06 NOTE — Telephone Encounter (Signed)
Needs Atenolol refill sent to [facility]

## 2012-10-06 NOTE — Telephone Encounter (Signed)
Spoke with pt and she mentioned that she preferred Atenolol 50 mg once daily when she was given written Rx to try by Dr. Graciela Husbands. Sent in refill for Atenolol 50 mg qd to [facility].

## 2012-10-09 ENCOUNTER — Ambulatory Visit: Payer: Managed Care, Other (non HMO) | Admitting: Internal Medicine

## 2012-10-12 ENCOUNTER — Telehealth: Payer: Self-pay

## 2012-10-12 NOTE — Telephone Encounter (Addendum)
Patient's mother called to report that her daughter called her on the phone to report that after sitting in a chair for about 30 minutes her feet turned "blueish in color" but when she got up and walked around the feet were normal in color. She was not witness to this but states that her daughter tends to cross her legs frequently and/or wrap her legs around the chair. The patient did not eat much on Saturday because she did not feel that well but oral intake was normal yesterday and today. Advised her to make sure that she stays well hydrated at all times and not to keep legs crossed and/or wrap them around a chair because it is not good for the circulation. She will let us know if this reoccurs. Patient's Mom is aware that Dr.Klein is out for 2 weeks but will let him know that she called.

## 2013-08-05 ENCOUNTER — Other Ambulatory Visit: Payer: Self-pay | Admitting: Internal Medicine

## 2013-10-21 ENCOUNTER — Encounter: Payer: Self-pay | Admitting: Internal Medicine

## 2013-10-21 ENCOUNTER — Ambulatory Visit (INDEPENDENT_AMBULATORY_CARE_PROVIDER_SITE_OTHER): Payer: 59 | Admitting: Internal Medicine

## 2013-10-21 VITALS — BP 132/72 | HR 84 | Temp 99.1°F | Resp 18 | Ht 63.0 in | Wt 292.8 lb

## 2013-10-21 DIAGNOSIS — E669 Obesity, unspecified: Secondary | ICD-10-CM

## 2013-10-21 DIAGNOSIS — E282 Polycystic ovarian syndrome: Secondary | ICD-10-CM

## 2013-10-21 DIAGNOSIS — R000 Tachycardia, unspecified: Principal | ICD-10-CM

## 2013-10-21 DIAGNOSIS — G90A Postural orthostatic tachycardia syndrome (POTS): Secondary | ICD-10-CM

## 2013-10-21 DIAGNOSIS — N920 Excessive and frequent menstruation with regular cycle: Secondary | ICD-10-CM

## 2013-10-21 DIAGNOSIS — E559 Vitamin D deficiency, unspecified: Secondary | ICD-10-CM

## 2013-10-21 DIAGNOSIS — N926 Irregular menstruation, unspecified: Secondary | ICD-10-CM

## 2013-10-21 DIAGNOSIS — N921 Excessive and frequent menstruation with irregular cycle: Secondary | ICD-10-CM

## 2013-10-21 DIAGNOSIS — I498 Other specified cardiac arrhythmias: Secondary | ICD-10-CM

## 2013-10-21 DIAGNOSIS — I951 Orthostatic hypotension: Principal | ICD-10-CM

## 2013-10-21 LAB — COMPREHENSIVE METABOLIC PANEL
ALK PHOS: 36 U/L — AB (ref 39–117)
ALT: 25 U/L (ref 0–35)
AST: 20 U/L (ref 0–37)
Albumin: 3.6 g/dL (ref 3.5–5.2)
BUN: 12 mg/dL (ref 6–23)
CALCIUM: 9.4 mg/dL (ref 8.4–10.5)
CHLORIDE: 107 meq/L (ref 96–112)
CO2: 26 mEq/L (ref 19–32)
Creatinine, Ser: 0.7 mg/dL (ref 0.4–1.2)
GFR: 118.97 mL/min (ref >60.00)
Glucose, Bld: 80 mg/dL (ref 70–99)
Potassium: 4.4 mEq/L (ref 3.5–5.1)
Sodium: 139 mEq/L (ref 135–145)
Total Bilirubin: 0.4 mg/dL (ref 0.2–1.2)
Total Protein: 6.7 g/dL (ref 6.0–8.3)

## 2013-10-21 LAB — VITAMIN D 25 HYDROXY (VIT D DEFICIENCY, FRACTURES): VITD: 10.22 ng/mL — ABNORMAL LOW (ref 30.00–100.00)

## 2013-10-21 LAB — HEMOGLOBIN A1C: HEMOGLOBIN A1C: 4.7 % (ref 4.6–6.5)

## 2013-10-21 MED ORDER — ATENOLOL 50 MG PO TABS: 50.0000 mg | ORAL_TABLET | Freq: Two times a day (BID) | ORAL | Status: DC

## 2013-10-21 MED ORDER — VENLAFAXINE HCL ER 150 MG PO TB24: 1.0000 | ORAL_TABLET | Freq: Every day | ORAL | Status: DC

## 2013-10-21 NOTE — Progress Notes (Signed)
Patient ID: Julie ShileyMikayla Hammond, female   DOB: [birth year], 23 y.o.   MRN: [id number]   Patient Active Problem List   Diagnosis Date Noted  . Unspecified vitamin D deficiency 10/23/2013  . POTS (postural orthostatic tachycardia syndrome) 08/27/2012  . Family history of cardiac disorder 08/27/2012  . Syncope 08/27/2012  . Plantar warts 07/12/2012  . Obesity (BMI 30-39.9) 11/27/2011  . Polycystic ovarian syndrome 11/27/2011  . Anxiety with obsessional features 11/27/2011    Subjective:  CC:   No chief complaint on file.   HPI:   Julie Hammond is a 23 y.o. female who presents for Follow up on chronic conditions including morbid obesity,PCOS,  generalized anxiety,  And POTS.    She was last  seen  By me in May 014.  She has lost 9 lbs but BMI still > 50.  She is not making a concerted effort to lose weight.   She is no longer taking zoloft. For GAD. She has been seeing a psychiatrist and therapy was changed to Pristiq by Dr. Lucianne MussLima but she can't afford to see her every week and the medication is costing her  $60/month   Periods are irregular  And occurring about every 3 months.  At times they are very heavy.  She is not sexually active.   POTs ;  Her atenolol wearing off early at once daily dosing and her tachycardia and light headedness are recurrent.    Past Medical History  Diagnosis Date  . Chicken pox   . Allergy   . Syncope and collapse     History reviewed. No pertinent past surgical history.     The following portions of the patient's history were reviewed and updated as appropriate: Allergies, current medications, and problem list.    Review of Systems:   Patient denies headache, fevers, malaise, unintentional weight loss, skin rash, eye pain, sinus congestion and sinus pain, sore throat, dysphagia,  hemoptysis , cough, dyspnea, wheezing, chest pain, palpitations, orthopnea, edema, abdominal pain, nausea, melena, diarrhea, constipation, flank pain,  dysuria, hematuria, urinary  Frequency, nocturia, numbness, tingling, seizures,  Focal weakness, Loss of consciousness,  Tremor, insomnia, depression, anxiety, and suicidal ideation.     History   Social History  . Marital Status: Single    Spouse Name: N/A    Number of Children: N/A  . Years of Education: N/A   Occupational History  . Not on file.   Social History Main Topics  . Smoking status: Never Smoker   . Smokeless tobacco: Never Used  . Alcohol Use: No  . Drug Use: No  . Sexual Activity: Not Currently    Birth Control/ Protection: Pill   Other Topics Concern  . Not on file   Social History Narrative  . No narrative on file    Objective:  Filed Vitals:   10/21/13 1104  BP: 132/72  Pulse: 84  Temp: 99.1 F (37.3 C)  Resp: 18     General appearance: alert, cooperative and appears stated age Ears: normal TM's and external ear canals both ears Throat: lips, mucosa, and tongue normal; teeth and gums normal Neck: no adenopathy, no carotid bruit, supple, symmetrical, trachea midline and thyroid not enlarged, symmetric, no tenderness/mass/nodules Back: symmetric, no curvature. ROM normal. No CVA tenderness. Lungs: clear to auscultation bilaterally Heart: regular rate and rhythm, S1, S2 normal, no murmur, click, rub or gallop Abdomen: soft, non-tender; bowel sounds normal; no masses,  no organomegaly Pulses: 2+ and symmetric Skin: Skin color, texture, turgor normal.  No rashes or lesions Lymph nodes: Cervical, supraclavicular, and axillary nodes normal.  Assessment and Plan:  Unspecified vitamin D deficiency Drisdol x 3 months , then 2000 units daily   Obesity (BMI 30-39.9) I have addressed  BMI and recommended wt loss of 12 lbs over the next 3 months using a low glycemic index diet and regular exercise a minimum of 5 days per week.   She has no signs of diabetes by fasting labs.  Thyroid and lipids  Are still pending.    POTS (postural orthostatic  tachycardia syndrome) diagnosed in June by Berton Mount.  Will increase the Atenolol to twice daily for recurrent symptoms.  Advised to increase noncaffeinated liquid intake    Updated Medication List Outpatient Encounter Prescriptions as of 10/21/2013  Medication Sig  . atenolol (TENORMIN) 50 MG tablet Take 1 tablet (50 mg total) by mouth 2 (two) times daily.  . ergocalciferol (DRISDOL) 50000 UNITS capsule Take 1 capsule (50,000 Units total) by mouth once a week. With food  . Multiple Vitamin (MULTIVITAMIN) tablet Take 1 tablet by mouth daily.  . Venlafaxine HCl 150 MG TB24 Take 1 tablet (150 mg total) by mouth daily.  . [DISCONTINUED] atenolol (TENORMIN) 50 MG tablet TAKE ONE TABLET BY MOUTH ONE TIME DAILY   . [DISCONTINUED] sertraline (ZOLOFT) 100 MG tablet Take 1 tablet (100 mg total) by mouth daily.     Orders Placed This Encounter  Procedures  . Hemoglobin A1c  . Comprehensive metabolic panel  . CBC with Differential  . Lipid panel  . TSH  . Vit D  25 hydroxy (rtn osteoporosis monitoring)    Return in about 3 months (around 01/21/2014).

## 2013-10-21 NOTE — Patient Instructions (Addendum)
We are changing your medication from pristic to generic effexor   The dose is One tablet daily in the morning   We are increasing the atenolol to two times daily for your POTS syndrome  I want you to start exercising a minimum of 3 days per week ,  30 minutes I want you breathing hard!!!!  No coasting!!  I want you to keep up the weight loss.  4 lbs a month is entirely achieivable using a low carb diet.  This is  One version of a  "Low GI"  Diet:  It will still lower your blood sugars and allow you to lose 4 to 8  lbs  per month if you follow it carefully.  Your goal with exercise is a minimum of 30 minutes of aerobic exercise 5 days per week (Walking does not count once it becomes easy!)    All of the foods can be found at grocery stores and in bulk at [employer].  The Atkins protein bars and shakes are available in more varieties at Target, WalMart and [employer]'s Foods.     7 AM Breakfast:  Choose from the following:  Low carbohydrate Protein  Shakes (I recommend the EAS AdvantEdge "Carb Control" shakes  Or the low carb shakes by Atkins.    2.5 carbs   Arnold's "Sandwhich Thin"toasted  w/ peanut butter (no jelly: about 20 net carbs  "Bagel Thin" with cream cheese and salmon: about 20 carbs   a scrambled egg/bacon/cheese burrito made with Mission's "carb balance" whole wheat tortilla  (about 10 net carbs )   Avoid cereal and bananas, oatmeal and cream of wheat and grits. They are loaded with carbohydrates!   10 AM: high protein snack  Protein bar by Atkins (the snack size, under 200 cal, usually < 6 net carbs).    A stick of cheese:  Around 1 carb,  100 cal     Dannon Light n Fit [place] Yogurt  (80 cal, 8 carbs)  Other so called "protein bars" and Greek yogurts tend to be loaded with carbohydrates.  Remember, in food advertising, the word "energy" is synonymous for " carbohydrate."  Lunch:   A Sandwich using the bread choices listed, Can use any  Eggs,  lunchmeat, grilled meat or canned  tuna), avocado, regular mayo/mustard  and cheese.  A Salad using blue cheese, ranch,  Goddess or vinagrette,  No croutons or "confetti" and no "candied nuts" but regular nuts OK.   No pretzels or chips.  Pickles and miniature sweet peppers are a good low carb alternative that provide a "crunch"  The bread is the only source of carbohydrate in a sandwich and  can be decreased by trying some of these alternatives to traditional loaf bread  Joseph's makes a pita bread and a flat bread that are 50 cal and 4 net carbs available at BJs and WalMart.  This can be toasted to use with hummous as well  Toufayan makes a low carb flatbread that's 100 cal and 9 net carbs available at [employer] and [employer]  Mission makes 2 sizes of  Low carb whole wheat tortilla  (The large one is 210 cal and 6 net carbs) Avoid "Low fat dressings, as well as Reyne DumasCatalina and [address] dressings They are loaded with sugar!   3 PM/ Mid day  Snack:  Consider  1 ounce of  almonds, walnuts, pistachios, pecans, peanuts,  Macadamia nuts or a nut medley.  Avoid "granola"; the dried  cranberries and raisins are loaded with carbohydrates. Mixed nuts as long as there are no raisins,  cranberries or dried fruit.    Try the prosciutto/mozzarella cheese sticks by Fiorruci  In deli /backery section   High protein      6 PM  Dinner:     Meat/fowl/fish with a green salad, and either broccoli, cauliflower, green beans, spinach, brussel sprouts or  Lima beans. DO NOT BREAD THE PROTEIN!!      There is a low carb pasta by Dreamfield's that is acceptable and tastes great: only 5 digestible carbs/serving.( All grocery stores but BJs carry it )  Try Kai Levins Angelo's chicken piccata or chicken or eggplant parm over low carb pasta.(Lowes and BJs)   Clifton Custard Sanchez's "Carnitas" (pulled pork, no sauce,  0 carbs) or his beef pot roast to make a dinner burrito (at BJ's)  Pesto over low carb pasta (bj's sells a good quality pesto in the center refrigerated  section of the deli   Try satueeing  Roosvelt Harps with mushroooms  Whole wheat pasta is still full of digestible carbs and  Not as low in glycemic index as Dreamfield's.   Brown rice is still rice,  So skip the rice and noodles if you eat [place] or [place] (or at least limit to 1/2 cup)  9 PM snack :   Breyer's "low carb" fudgsicle or  ice cream bar (Carb Smart line), or  Weight Watcher's ice cream bar , or another "no sugar added" ice cream;  a serving of fresh berries/cherries with whipped cream   Cheese or DANNON'S LlGHT N FIT GREEK YOGURT  8 ounces of [place] unsweetened almond/cococunut milk    Avoid bananas, pineapple, grapes  and watermelon on a regular basis because they are high in sugar.  THINK OF THEM AS DESSERT  Remember that snack Substitutions should be less than 10 NET carbs per serving and meals < 20 carbs. Remember to subtract fiber grams to get the "net carbs."

## 2013-10-21 NOTE — Progress Notes (Signed)
Pre visit review using our clinic review tool, if applicable. No additional management support is needed unless otherwise documented below in the visit note. 

## 2013-10-23 ENCOUNTER — Encounter: Payer: Self-pay | Admitting: Internal Medicine

## 2013-10-23 DIAGNOSIS — E559 Vitamin D deficiency, unspecified: Secondary | ICD-10-CM | POA: Insufficient documentation

## 2013-10-23 MED ORDER — ERGOCALCIFEROL 1.25 MG (50000 UT) PO CAPS: 50000.0000 [IU] | ORAL_CAPSULE | ORAL | Status: DC

## 2013-10-23 NOTE — Assessment & Plan Note (Signed)
diagnosed in June by Berton MountSteve Klein.  Will increase the Atenolol to twice daily for recurrent symptoms.  Advised to increase noncaffeinated liquid intake

## 2013-10-23 NOTE — Assessment & Plan Note (Signed)
Drisdol x 3 months , then 2000 units daily

## 2013-10-23 NOTE — Assessment & Plan Note (Signed)
I have addressed  BMI and recommended wt loss of 12 lbs over the next 3 months using a low glycemic index diet and regular exercise a minimum of 5 days per week.   She has no signs of diabetes by fasting labs.  Thyroid and lipids  Are still pending.

## 2013-10-25 ENCOUNTER — Encounter: Payer: Self-pay | Admitting: *Deleted

## 2013-10-27 ENCOUNTER — Encounter: Payer: Self-pay | Admitting: *Deleted

## 2014-01-21 ENCOUNTER — Other Ambulatory Visit: Payer: Self-pay | Admitting: Internal Medicine

## 2014-01-27 ENCOUNTER — Encounter: Payer: Self-pay | Admitting: Internal Medicine

## 2014-01-27 ENCOUNTER — Ambulatory Visit (INDEPENDENT_AMBULATORY_CARE_PROVIDER_SITE_OTHER): Payer: 59 | Admitting: Internal Medicine

## 2014-01-27 ENCOUNTER — Encounter (INDEPENDENT_AMBULATORY_CARE_PROVIDER_SITE_OTHER): Payer: Self-pay

## 2014-01-27 VITALS — BP 106/88 | HR 112 | Temp 98.9°F | Resp 18 | Ht 63.0 in | Wt 282.5 lb

## 2014-01-27 DIAGNOSIS — R000 Tachycardia, unspecified: Secondary | ICD-10-CM

## 2014-01-27 DIAGNOSIS — F419 Anxiety disorder, unspecified: Secondary | ICD-10-CM

## 2014-01-27 DIAGNOSIS — E669 Obesity, unspecified: Secondary | ICD-10-CM

## 2014-01-27 DIAGNOSIS — E559 Vitamin D deficiency, unspecified: Secondary | ICD-10-CM

## 2014-01-27 DIAGNOSIS — G90A Postural orthostatic tachycardia syndrome (POTS): Secondary | ICD-10-CM

## 2014-01-27 DIAGNOSIS — H9313 Tinnitus, bilateral: Secondary | ICD-10-CM

## 2014-01-27 DIAGNOSIS — I498 Other specified cardiac arrhythmias: Secondary | ICD-10-CM

## 2014-01-27 DIAGNOSIS — Z23 Encounter for immunization: Secondary | ICD-10-CM

## 2014-01-27 DIAGNOSIS — I951 Orthostatic hypotension: Secondary | ICD-10-CM

## 2014-01-27 DIAGNOSIS — F418 Other specified anxiety disorders: Secondary | ICD-10-CM

## 2014-01-27 MED ORDER — VENLAFAXINE HCL ER 75 MG PO CP24: 75.0000 mg | ORAL_CAPSULE | Freq: Every day | ORAL | Status: DC

## 2014-01-27 NOTE — Progress Notes (Signed)
Pre-visit discussion using our clinic review tool. No additional management support is needed unless otherwise documented below in the visit note.  

## 2014-01-27 NOTE — Progress Notes (Signed)
Patient ID: Julie Hammond, female   DOB: 1990/10/26, 23 y.o.   MRN: [id number]  Patient Active Problem List   Diagnosis Date Noted  . Tinnitus, subjective 01/30/2014  . Vitamin D deficiency 10/23/2013  . POTS (postural orthostatic tachycardia syndrome) 08/27/2012  . Family history of cardiac disorder 08/27/2012  . Syncope 08/27/2012  . Plantar warts 07/12/2012  . Obesity (BMI 30-39.9) 11/27/2011  . Polycystic ovarian syndrome 11/27/2011  . Anxiety with obsessional features 11/27/2011    Subjective:  CC:   Chief Complaint  Patient presents with  . Follow-up    general    HPI:   Julie RoysMikayla Hammond is a 23 y.o. female who presents for  Follow up on multiple issues including GAD,  Chronic orthostasis secondary to POTS syndrome and morbid obesity.    She was last seen in August,  At which time therapy for GAD  was changed.  Effexor started,  History:  zoloft changed to pristiq by Dr Lucianne MussLima, but patient could not afford to continue medication or follow up with psychiatrist.    POTS:  Atenolol dose was increased to bid at last visit for persistent tachycardia. She is tolerating the change in medication.  .   Obesity: wt loss last time acknowledged.  She  Has lost another 10 lbs for a total of 19 lbs since June BMi now 50 . 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 Chicken pox   . Allergy   . Syncope and collapse     No past surgical history on file.     The following portions of the patient's history were reviewed and updated as appropriate: Allergies, current medications, and problem list.    Review of Systems:   Patient denies headache, fevers, malaise, unintentional weight loss, skin rash, eye pain, sinus congestion and sinus pain, sore throat, dysphagia,  hemoptysis , cough, dyspnea, wheezing, chest pain, palpitations, orthopnea, edema, abdominal pain, nausea, melena,  diarrhea, constipation, flank pain, dysuria, hematuria, urinary  Frequency, nocturia, numbness, tingling, seizures,  Focal weakness, Loss of consciousness,  Tremor, insomnia, depression, anxiety, and suicidal ideation.     History   Social History  . Marital Status: Single    Spouse Name: N/A    Number of Children: N/A  . Years of Education: N/A   Occupational History  . Not on file.   Social History Main Topics  . Smoking status: Never Smoker   . Smokeless tobacco: Never Used  . Alcohol Use: No  . Drug Use: No  . Sexual Activity: Not Currently    Birth Control/ Protection: Pill   Other Topics Concern  . Not on file   Social History Narrative    Objective:  Filed Vitals:   01/27/14 1051  BP: 106/88  Pulse: 112  Temp: 98.9 F (37.2 C)  Resp: 18     General appearance: pale, alert, cooperative and appears stated age Ears: normal TM's and external ear canals both ears Throat: lips, mucosa, and tongue normal; teeth and gums normal Neck: no adenopathy, no carotid bruit, supple, symmetrical, trachea midline and thyroid not enlarged, symmetric, no tenderness/mass/nodules Back: symmetric, no curvature. ROM normal. No CVA tenderness. Lungs: clear to auscultation bilaterally Heart: regular rate and rhythm, S1, S2 normal, no murmur, click, rub or gallop Abdomen: soft, non-tender; bowel sounds normal; no masses,  no organomegaly Pulses: 2+ and symmetric Skin: Skin color, texture, turgor normal.  No rashes or lesions Lymph nodes: Cervical, supraclavicular, and axillary nodes normal. Psych: affect normal, makes good eye contact. No fidgeting,  Smiles easily.  Denies suicidal thoughts   Assessment and Plan:  Tinnitus, subjective Accompanied by mild discomfort.  TM's are normal appearing; trial of Flonase.  Advised to wear ear protection at work since there is occupational exposure to loud noise frequently.   Anxiety with obsessional features Tolerating effexor;  discussed  increasing the dose to 225 mg   Vitamin D deficiency Advised to increase dose to 2000 units daily for the next 3-4 months   POTS (postural orthostatic tachycardia syndrome) Increased dose today to 75 mg bid.   Obesity (BMI 30-39.9) I have congratulated her in reduction of   BMI to 50  and encouraged  Continued weight loss with goal of 10% of body weight over the next 6 months using a low glycemic index diet and regular exercise a minimum of 5 days per week.     Updated Medication List Outpatient Encounter Prescriptions as of 01/27/2014  Medication Sig  . atenolol (TENORMIN) 50 MG tablet Take 1.5 tablets (75 mg total) by mouth 2 (two) times daily.  . Cholecalciferol (VITAMIN D3) 1000 UNITS CAPS Take 1 capsule by mouth daily.  . Multiple Vitamin (MULTIVITAMIN) tablet Take 1 tablet by mouth daily.  Marland Kitchen. venlafaxine XR (EFFEXOR-XR) 150 MG 24 hr capsule TAKE ONE CAPSULE BY MOUTH ONE TIME DAILY   . [DISCONTINUED] atenolol (TENORMIN) 50 MG tablet Take 1 tablet (50 mg total) by mouth 2 (two) times daily.  . ergocalciferol (DRISDOL) 50000 UNITS capsule Take 1 capsule (50,000 Units total) by mouth once a week. With food  . venlafaxine XR (EFFEXOR-XR) 75 MG 24 hr capsule Take 1 capsule (75 mg total) by mouth daily with breakfast.     Orders Placed This Encounter  Procedures  . Td vaccine preservative free greater than or equal to 7yo IM    Return in about 3 months (around 04/29/2014).

## 2014-01-27 NOTE — Patient Instructions (Addendum)
Try flonase  Or nasocort 2 squirts on each side daily to see if it helps the ear pin whiy may be from sinus congestion  If not,  It's probably from the sound at the shop.  Increasing the effexor  to 225 mg daily using an additional 75 mg capsule  trial of increased atenolol to 75 mg twice daily (1.5 tablets twice daily)   Increase your vitamin D to 2000 units daily for the  winter months  Tinnitus Sounds you hear in your ears and coming from within the ear is called tinnitus. This can be a symptom of many ear disorders. It is often associated with hearing loss.  Tinnitus can be seen with:  Infections.  Ear blockages such as wax buildup.  Meniere's disease.  Ear damage.  Inherited.  Occupational causes. While irritating, it is not usually a threat to health. When the cause of the tinnitus is wax, infection in the middle ear, or foreign body it is easily treated. Hearing loss will usually be reversible.  TREATMENT  When treating the underlying cause does not get rid of tinnitus, it may be necessary to get rid of the unwanted sound by covering it up with more pleasant background noises. This may include music, the radio etc. There are tinnitus maskers which can be worn which produce background noise to cover up the tinnitus. Avoid all medications which tend to make tinnitus worse such as alcohol, caffeine, aspirin, and nicotine. There are many soothing background tapes such as rain, ocean, thunderstorms, etc. These soothing sounds help with sleeping or resting. Keep all follow-up appointments and referrals. This is important to identify the cause of the problem. It also helps avoid complications, impaired hearing, disability, or chronic pain. Document Released: 02/25/2005 Document Revised: 05/20/2011 Document Reviewed: 10/14/2007 [facility] Patient Information 2015 [place] [place]. This information is not intended to replace advice given to you by your health care provider. Make sure you  discuss any questions you have with your health care provider.

## 2014-01-30 DIAGNOSIS — H9319 Tinnitus, unspecified ear: Secondary | ICD-10-CM | POA: Insufficient documentation

## 2014-01-30 MED ORDER — ATENOLOL 50 MG PO TABS: 75.0000 mg | ORAL_TABLET | Freq: Two times a day (BID) | ORAL | Status: DC

## 2014-01-30 NOTE — Assessment & Plan Note (Signed)
Tolerating effexor;  discussed increasing the dose to 225 mg

## 2014-01-30 NOTE — Assessment & Plan Note (Signed)
Increased dose today to 75 mg bid.

## 2014-01-30 NOTE — Assessment & Plan Note (Signed)
I have congratulated her in reduction of   BMI to 50  and encouraged  Continued weight loss with goal of 10% of body weight over the next 6 months using a low glycemic index diet and regular exercise a minimum of 5 days per week.

## 2014-01-30 NOTE — Assessment & Plan Note (Signed)
Advised to increase dose to 2000 units daily for the next 3-4 months

## 2014-01-30 NOTE — Assessment & Plan Note (Signed)
Accompanied by mild discomfort.  TM's are normal appearing; trial of Flonase.  Advised to wear ear protection at work since there is occupational exposure to loud noise frequently.

## 2014-04-11 ENCOUNTER — Other Ambulatory Visit: Payer: Self-pay | Admitting: Internal Medicine

## 2014-04-29 ENCOUNTER — Ambulatory Visit: Payer: 59 | Admitting: Internal Medicine

## 2014-05-14 ENCOUNTER — Ambulatory Visit (INDEPENDENT_AMBULATORY_CARE_PROVIDER_SITE_OTHER): Payer: 59 | Admitting: Internal Medicine

## 2014-05-14 VITALS — BP 106/78 | HR 122 | Temp 98.4°F | Resp 20 | Ht 64.0 in | Wt 273.0 lb

## 2014-05-14 DIAGNOSIS — J029 Acute pharyngitis, unspecified: Secondary | ICD-10-CM | POA: Diagnosis not present

## 2014-05-14 MED ORDER — AMOXICILLIN 875 MG PO TABS: 875.0000 mg | ORAL_TABLET | Freq: Two times a day (BID) | ORAL | Status: DC

## 2014-05-14 NOTE — Progress Notes (Signed)
   Subjective:    Patient ID: Julie Hammond, female    DOB: May 17, 1990, 24 y.o.   MRN: [id number] This chart was scribed for Tonye Pearsonobert P Irbin Fines, MD by [place] Peace, ED Scribe. The patient was seen in [facility]. The patient's care was started at 11:44 AM.  HPI HPI Comments: Julie Hammond is a 24 y.o. female who [facility] of bilateral ear pain and sore throat. Mother confirms exposure with sick contact. Pt's brother is also suffering from same symptoms as well. No complaints of rhinorrhea or fever. Pt is non-smoker.  Patient sister positive for strep  Patient Active Problem List   Diagnosis Date Noted  . Tinnitus, subjective 01/30/2014  . Vitamin D deficiency 10/23/2013  . POTS (postural orthostatic tachycardia syndrome) 08/27/2012  . Family history of cardiac disorder 08/27/2012  . Syncope 08/27/2012  . Plantar warts 07/12/2012  . Obesity (BMI 30-39.9) 11/27/2011  . Polycystic ovarian syndrome 11/27/2011  . Anxiety with obsessional features 11/27/2011     Review of Systems  Constitutional: Negative for fever.  HENT: Positive for ear pain and sore throat. Negative for rhinorrhea.        Objective:   Physical Exam  HENT:  Mouth: Both tonsils are red and swollen with exudate.    tender anterior cervical nodes TMs clear Nares clear Lungs clear        Assessment & Plan:  , Pharyngitis likely secondary to strep  Amoxicillin 875 twice a day 10 days

## 2014-05-20 ENCOUNTER — Other Ambulatory Visit: Payer: Self-pay | Admitting: Internal Medicine

## 2014-05-25 ENCOUNTER — Ambulatory Visit (INDEPENDENT_AMBULATORY_CARE_PROVIDER_SITE_OTHER): Payer: 59 | Admitting: Internal Medicine

## 2014-05-25 ENCOUNTER — Encounter: Payer: Self-pay | Admitting: Internal Medicine

## 2014-05-25 VITALS — BP 112/78 | HR 85 | Temp 98.4°F | Resp 16 | Ht 64.0 in | Wt 276.8 lb

## 2014-05-25 DIAGNOSIS — H9313 Tinnitus, bilateral: Secondary | ICD-10-CM | POA: Diagnosis not present

## 2014-05-25 DIAGNOSIS — J029 Acute pharyngitis, unspecified: Secondary | ICD-10-CM

## 2014-05-25 DIAGNOSIS — F419 Anxiety disorder, unspecified: Secondary | ICD-10-CM | POA: Diagnosis not present

## 2014-05-25 DIAGNOSIS — G90A Postural orthostatic tachycardia syndrome (POTS): Secondary | ICD-10-CM

## 2014-05-25 DIAGNOSIS — I498 Other specified cardiac arrhythmias: Secondary | ICD-10-CM

## 2014-05-25 DIAGNOSIS — E669 Obesity, unspecified: Secondary | ICD-10-CM | POA: Diagnosis not present

## 2014-05-25 DIAGNOSIS — R000 Tachycardia, unspecified: Secondary | ICD-10-CM

## 2014-05-25 DIAGNOSIS — I951 Orthostatic hypotension: Secondary | ICD-10-CM

## 2014-05-25 DIAGNOSIS — F418 Other specified anxiety disorders: Secondary | ICD-10-CM

## 2014-05-25 MED ORDER — LAMOTRIGINE 25 MG PO TABS: 25.0000 mg | ORAL_TABLET | Freq: Every day | ORAL | Status: DC

## 2014-05-25 MED ORDER — VENLAFAXINE HCL ER 75 MG PO CP24: 225.0000 mg | ORAL_CAPSULE | Freq: Every day | ORAL | Status: DC

## 2014-05-25 NOTE — Patient Instructions (Signed)
Your throat looks fine,  The scratchy feeling may be due to post nasal drip , allergic rhinitis, or a viral syndrome ,  But is VERY UNLIKELY TO BE another case of strep thorat  Lavage your sinuses twice daily with NeilMed Sinus rinse .  Use benadryl 25 mg at bedtime   Gargle with salt water often for the sore throat.  If the throat is no better  In 3 to 4 days OR  if you develop T > 100.4,  Green nasal discharge,  Or facial pain,  Call  The office and let me know  I have added lamictal to help stabilize your mood.  Take it at bedtime.  The dose can be increased gradually , if needed,

## 2014-05-25 NOTE — Progress Notes (Signed)
Patient ID: Julie Hammond, female   DOB: 01-Mar-1991, 24 y.o.   MRN: [id number]  Patient Active Problem List   Diagnosis Date Noted  . Acute pharyngitis 05/28/2014  . Tinnitus, subjective 01/30/2014  . Vitamin D deficiency 10/23/2013  . POTS (postural orthostatic tachycardia syndrome) 08/27/2012  . Family history of cardiac disorder 08/27/2012  . Syncope 08/27/2012  . Plantar warts 07/12/2012  . Obesity (BMI 30-39.9) 11/27/2011  . Polycystic ovarian syndrome 11/27/2011  . Anxiety with obsessional features 11/27/2011    Subjective:  CC:   Chief Complaint  Patient presents with  . Follow-up    general follow up "tinnitus has pretty much gone away"    HPI:   Julie Hammond is a 24 y.o. female who presents for follow up on tinnitus,  Obesity, depression  and POTS.  She feels generally well except for the recurrence of a mild sore throat which started this morning accompanied by an unpleasant taste in her mouth.  She was treated for strep throat 2 weeks ago with amoxicillin for 10 days, which she finsised 3 days ago .  She denies fevers,  Cough, body aches and diarrhea.  Her POTS symptoms improved after increasing the dose of her beta blocker.  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   She has been having  depressive symptoms episodically, with increased social isolation and decresed motivation.  She denies suicial ideation , hopelessness and tearfulness.  Sleeping ok,  Not skipping classes.  No manic activity. Her anxiey with obsessive tendencies is controlled.    Past Medical History  Diagnosis Date  . Chicken pox   . Allergy   . Syncope and collapse   . Depression   . Anxiety     No past surgical history on file.     The following portions of the patient's history were reviewed and updated as appropriate: Allergies, current medications, and problem list.    Review of Systems:   Patient denies  headache, fevers, malaise, unintentional weight loss, skin rash, eye pain, sinus congestion and sinus pain, sore throat, dysphagia,  hemoptysis , cough, dyspnea, wheezing, chest pain, palpitations, orthopnea, edema, abdominal pain, nausea, melena, diarrhea, constipation, flank pain, dysuria, hematuria, urinary  Frequency, nocturia, numbness, tingling, seizures,  Focal weakness, Loss of consciousness,  Tremor, insomnia, depression, anxiety, and suicidal ideation.     History   Social History  . Marital Status: Single    Spouse Name: N/A  . Number of Children: N/A  . Years of Education: N/A   Occupational History  . Not on file.   Social History Main Topics  . Smoking status: Never Smoker   . Smokeless tobacco: Never Used  . Alcohol Use: No  . Drug Use: No  . Sexual Activity: Not Currently    Birth Control/ Protection: Pill   Other Topics Concern  . Not on file   Social History Narrative    Objective:  Filed Vitals:   05/25/14 1609  BP: 112/78  Pulse: 85  Temp: 98.4 F (36.9 C)  Resp: 16     General appearance: alert, cooperative and appears stated age Ears: normal TM's and external ear canals both ears Throat: lips, mucosa, and tongue normal; teeth and gums normal Neck: no adenopathy, no carotid bruit, supple, symmetrical, trachea midline and thyroid not enlarged, symmetric, no tenderness/mass/nodules Back: symmetric, no curvature. ROM normal. No CVA tenderness. Lungs: clear to auscultation bilaterally Heart: regular rate and rhythm, S1, S2 normal, no  murmur, click, rub or gallop Abdomen: soft, non-tender; bowel sounds normal; no masses,  no organomegaly Pulses: 2+ and symmetric Skin: Skin color, texture, turgor normal. No rashes or lesions Lymph nodes: Cervical, supraclavicular, and axillary nodes normal.  Assessment and Plan:  Tinnitus, subjective Attributed to environmental insults.  Improved with use of ear protectors.    POTS (postural orthostatic  tachycardia syndrome) She has been asymptomatic with increased beta blockade.   Anxiety with obsessional features She is taking 225 mg of effexor currently.  Now having some negative symptoms alternating with anxeity.  Discussed adding low dose  lamictal to help stabilize her moods   Obesity (BMI 30-39.9) I have addressed  BMI and recommended a low glycemic index diet utilizing smaller more frequent meals to increase metabolism.  I have also recommended that patient start exercising with a goal of 30 minutes of aerobic exercise a minimum of 5 days per week.   Wt Readings from Last 3 Encounters:  05/25/14 276 lb 12 oz (125.533 kg)  05/14/14 273 lb (123.832 kg)  01/27/14 282 lb 8 oz (128.141 kg)     Acute pharyngitis Her sore throat is most likely viral given the mild HEENT  Symptoms  And normal exam.   I have explained that it is very unlikley she would have recurrent strep given recent resoltion after amox and normal exam.  Resume benadryl for PND, add  tylenol 650 mq 8 hrs for aches and pains, return if symptoms worsen.     A total of  25 minutes of face to face time was spent with patient more than half of which was spent in counselling and coordination of care    Updated Medication List Outpatient Encounter Prescriptions as of 05/25/2014  Medication Sig  . atenolol (TENORMIN) 50 MG tablet TAKE ONE AND ONE-HALF TABLETS BY MOUTH TWICE DAILY   . Cholecalciferol (VITAMIN D3) 1000 UNITS CAPS Take 1 capsule by mouth daily.  . Multiple Vitamin (MULTIVITAMIN) tablet Take 1 tablet by mouth daily.  Marland Kitchen venlafaxine XR (EFFEXOR-XR) 75 MG 24 hr capsule Take 3 capsules (225 mg total) by mouth daily with breakfast.  . [DISCONTINUED] venlafaxine XR (EFFEXOR-XR) 150 MG 24 hr capsule TAKE ONE CAPSULE BY MOUTH ONE TIME DAILY   . [DISCONTINUED] venlafaxine XR (EFFEXOR-XR) 75 MG 24 hr capsule Take 1 capsule (75 mg total) by mouth daily with breakfast.  . lamoTRIgine (LAMICTAL) 25 MG tablet Take 1 tablet  (25 mg total) by mouth daily.  . [DISCONTINUED] amoxicillin (AMOXIL) 875 MG tablet Take 1 tablet (875 mg total) by mouth 2 (two) times daily.  . [DISCONTINUED] ergocalciferol (DRISDOL) 50000 UNITS capsule Take 1 capsule (50,000 Units total) by mouth once a week. With food     No orders of the defined types were placed in this encounter.    No Follow-up on file.

## 2014-05-28 DIAGNOSIS — J029 Acute pharyngitis, unspecified: Secondary | ICD-10-CM | POA: Insufficient documentation

## 2014-05-28 NOTE — Assessment & Plan Note (Signed)
Attributed to environmental insults.  Improved with use of ear protectors.

## 2014-05-28 NOTE — Assessment & Plan Note (Signed)
She is taking 225 mg of effexor currently.  Now having some negative symptoms alternating with anxeity.  Discussed adding low dose  lamictal to help stabilize her moods

## 2014-05-28 NOTE — Assessment & Plan Note (Signed)
She has been asymptomatic with increased beta blockade.

## 2014-05-28 NOTE — Assessment & Plan Note (Signed)
I have addressed  BMI and recommended a low glycemic index diet utilizing smaller more frequent meals to increase metabolism.  I have also recommended that patient start exercising with a goal of 30 minutes of aerobic exercise a minimum of 5 days per week.   Wt Readings from Last 3 Encounters:  05/25/14 276 lb 12 oz (125.533 kg)  05/14/14 273 lb (123.832 kg)  01/27/14 282 lb 8 oz (128.141 kg)

## 2014-05-28 NOTE — Assessment & Plan Note (Signed)
Her sore throat is most likely viral given the mild HEENT  Symptoms  And normal exam.   I have explained that it is very unlikley she would have recurrent strep given recent resoltion after amox and normal exam.  Resume benadryl for PND, add  tylenol 650 mq 8 hrs for aches and pains, return if symptoms worsen.

## 2014-08-26 ENCOUNTER — Ambulatory Visit (INDEPENDENT_AMBULATORY_CARE_PROVIDER_SITE_OTHER): Payer: 59 | Admitting: Internal Medicine

## 2014-08-26 ENCOUNTER — Encounter: Payer: Self-pay | Admitting: Internal Medicine

## 2014-08-26 VITALS — BP 128/78 | HR 111 | Temp 98.6°F | Resp 14 | Ht 64.0 in | Wt 264.8 lb

## 2014-08-26 DIAGNOSIS — Z79899 Other long term (current) drug therapy: Secondary | ICD-10-CM | POA: Diagnosis not present

## 2014-08-26 DIAGNOSIS — F419 Anxiety disorder, unspecified: Secondary | ICD-10-CM

## 2014-08-26 DIAGNOSIS — F418 Other specified anxiety disorders: Secondary | ICD-10-CM

## 2014-08-26 DIAGNOSIS — R300 Dysuria: Secondary | ICD-10-CM

## 2014-08-26 DIAGNOSIS — N3090 Cystitis, unspecified without hematuria: Secondary | ICD-10-CM | POA: Diagnosis not present

## 2014-08-26 LAB — POCT URINALYSIS DIPSTICK
Bilirubin, UA: NEGATIVE
Glucose, UA: NEGATIVE
Ketones, UA: NEGATIVE
NITRITE UA: NEGATIVE
PH UA: 5.5
PROTEIN UA: 30
Spec Grav, UA: 1.025
Urobilinogen, UA: 0.2

## 2014-08-26 MED ORDER — SULFAMETHOXAZOLE-TRIMETHOPRIM 800-160 MG PO TABS: 1.0000 | ORAL_TABLET | Freq: Two times a day (BID) | ORAL | Status: DC

## 2014-08-26 MED ORDER — LAMOTRIGINE 25 MG PO TABS: 50.0000 mg | ORAL_TABLET | Freq: Every day | ORAL | Status: DC

## 2014-08-26 NOTE — Progress Notes (Signed)
Subjective:  Patient ID: Julie Hammond, female    DOB: 1990/03/25  Age: 24 y.o. MRN: [id number]  CC: The primary encounter diagnosis was Cystitis. Diagnoses of Dysuria, Encounter for long-term (current) use of high-risk medication, and Anxiety with obsessional features were also pertinent to this visit.  HPI Julie Hammond presents for follow up on depression with GAD and new onset dysuria. She was prescribed effexor xr and lamictal was added 3 months ago due to recurrence of anxiety and obsessive features .  She notes minor improvement in the anxiety but not  the depression   Dysuria x 3 days   Outpatient Prescriptions Prior to Visit  Medication Sig Dispense Refill  . atenolol (TENORMIN) 50 MG tablet TAKE ONE AND ONE-HALF TABLETS BY MOUTH TWICE DAILY  90 tablet 5  . Cholecalciferol (VITAMIN D3) 1000 UNITS CAPS Take 1 capsule by mouth daily.    . Multiple Vitamin (MULTIVITAMIN) tablet Take 1 tablet by mouth daily.    Marland Kitchen venlafaxine XR (EFFEXOR-XR) 75 MG 24 hr capsule Take 3 capsules (225 mg total) by mouth daily with breakfast. 90 capsule 1  . lamoTRIgine (LAMICTAL) 25 MG tablet Take 1 tablet (25 mg total) by mouth daily. 30 tablet 5   No facility-administered medications prior to visit.    Review of Systems;  Patient denies headache, fevers, malaise, unintentional weight loss, skin rash, eye pain, sinus congestion and sinus pain, sore throat, dysphagia,  hemoptysis , cough, dyspnea, wheezing, chest pain, palpitations, orthopnea, edema, abdominal pain, nausea, melena, diarrhea, constipation, flank pain, dysuria, hematuria, urinary  Frequency, nocturia, numbness, tingling, seizures,  Focal weakness, Loss of consciousness,  Tremor, insomnia, depression, anxiety, and suicidal ideation.      Objective:  BP 128/78 mmHg  Pulse 111  Temp(Src) 98.6 F (37 C) (Oral)  Resp 14  Ht '5\' 4"'  (1.626 m)  Wt 264 lb 12 oz (120.09 kg)  BMI 45.42 kg/m2  SpO2 99%  LMP 08/16/2014  BP  Readings from Last 3 Encounters:  08/26/14 128/78  05/25/14 112/78  05/14/14 106/78    Wt Readings from Last 3 Encounters:  08/26/14 264 lb 12 oz (120.09 kg)  05/25/14 276 lb 12 oz (125.533 kg)  05/14/14 273 lb (123.832 kg)    General appearance: alert, cooperative and appears stated age Ears: normal TM's and external ear canals both ears Throat: lips, mucosa, and tongue normal; teeth and gums normal Neck: no adenopathy, no carotid bruit, supple, symmetrical, trachea midline and thyroid not enlarged, symmetric, no tenderness/mass/nodules Back: symmetric, no curvature. ROM normal. No CVA tenderness. Lungs: clear to auscultation bilaterally Heart: regular rate and rhythm, S1, S2 normal, no murmur, click, rub or gallop Abdomen: soft, non-tender; bowel sounds normal; no masses,  no organomegaly Pulses: 2+ and symmetric Skin: Skin color, texture, turgor normal. No rashes or lesions Lymph nodes: Cervical, supraclavicular, and axillary nodes normal.  Lab Results  Component Value Date   HGBA1C 4.7 10/21/2013    Lab Results  Component Value Date   CREATININE 0.62 08/26/2014   CREATININE 0.7 10/21/2013   CREATININE 0.7 11/28/2011    Lab Results  Component Value Date   GLUCOSE 58* 08/26/2014   CHOL 144 11/28/2011   TRIG 53.0 11/28/2011   HDL 43.50 11/28/2011   LDLCALC 90 11/28/2011   ALT 18 08/26/2014   AST 17 08/26/2014   NA 142 08/26/2014   K 4.2 08/26/2014   CL 104 08/26/2014   CREATININE 0.62 08/26/2014   BUN 13 08/26/2014   CO2 25 08/26/2014  TSH 2.22 11/28/2011   HGBA1C 4.7 10/21/2013    No results found.  Assessment & Plan:   Problem List Items Addressed This Visit    Anxiety with obsessional features    Advised to increase the lamictal to 50 mg daily.  Continue effexor xr       Cystitis - Primary    empirici Septra DS.        Other Visit Diagnoses    Dysuria        Relevant Orders    POCT Urinalysis Dipstick (Completed)    Urine Culture  (Completed)    Urinalysis, Routine w reflex microscopic (Completed)    Encounter for long-term (current) use of high-risk medication        Relevant Orders    Comp Met (CMET)    Comp Met (CMET) (Completed)       I have changed Ms. Hammond's lamoTRIgine. I am also having her start on sulfamethoxazole-trimethoprim. Additionally, I am having her maintain her multivitamin, Vitamin D3, atenolol, venlafaxine XR, and Phenazopyrid-Cranbry-C-Probiot (AZO URINARY TRACT SUPPORT PO).  Meds ordered this encounter  Medications  . Phenazopyrid-Cranbry-C-Probiot (AZO URINARY TRACT SUPPORT PO)    Sig: Take 2 tablets by mouth 3 (three) times daily.  Marland Kitchen sulfamethoxazole-trimethoprim (BACTRIM DS,SEPTRA DS) 800-160 MG per tablet    Sig: Take 1 tablet by mouth 2 (two) times daily.    Dispense:  6 tablet    Refill:  0  . lamoTRIgine (LAMICTAL) 25 MG tablet    Sig: Take 2 tablets (50 mg total) by mouth daily.    Dispense:  60 tablet    Refill:  5    Medications Discontinued During This Encounter  Medication Reason  . lamoTRIgine (LAMICTAL) 25 MG tablet Reorder    Follow-up: Return in about 3 months (around 11/26/2014).   Crecencio Mc, MD

## 2014-08-26 NOTE — Patient Instructions (Signed)
I am increasing your lamictal to 50 mg at bedtime to help with your mood issues.  Please let me know if you do not tolerate the dose change  i am prescribing Septra DS for your UTI    Please take a probiotic ( Align, Floraque or [employer]) m  the generic version of one of these, or eat yogurt daily with live cultures for a minimum of 3 weeks to replenish the good "gut bacteria" that the antibiotic kills off ,  This is to prevent a serious antibiotic associated diarrhea  Called clostridium dificile colitis

## 2014-08-27 LAB — URINALYSIS, ROUTINE W REFLEX MICROSCOPIC
Bilirubin Urine: NEGATIVE
GLUCOSE, UA: NEGATIVE mg/dL
KETONES UR: NEGATIVE mg/dL
Nitrite: NEGATIVE
PROTEIN: 30 mg/dL — AB
Specific Gravity, Urine: 1.014 (ref 1.005–1.030)
UROBILINOGEN UA: 0.2 mg/dL (ref 0.0–1.0)
pH: 5.5 (ref 5.0–8.0)

## 2014-08-27 LAB — URINALYSIS, MICROSCOPIC ONLY
Casts: NONE SEEN
Crystals: NONE SEEN

## 2014-08-27 LAB — COMPREHENSIVE METABOLIC PANEL
ALBUMIN: 3.8 g/dL (ref 3.5–5.2)
ALT: 18 U/L (ref 0–35)
AST: 17 U/L (ref 0–37)
Alkaline Phosphatase: 41 U/L (ref 39–117)
BUN: 13 mg/dL (ref 6–23)
CHLORIDE: 104 meq/L (ref 96–112)
CO2: 25 meq/L (ref 19–32)
Calcium: 8.6 mg/dL (ref 8.4–10.5)
Creat: 0.62 mg/dL (ref 0.50–1.10)
GLUCOSE: 58 mg/dL — AB (ref 70–99)
POTASSIUM: 4.2 meq/L (ref 3.5–5.3)
Sodium: 142 mEq/L (ref 135–145)
TOTAL PROTEIN: 6.6 g/dL (ref 6.0–8.3)
Total Bilirubin: 0.2 mg/dL (ref 0.2–1.2)

## 2014-08-28 NOTE — Assessment & Plan Note (Signed)
Advised to increase the lamictal to 50 mg daily.  Continue effexor xr

## 2014-08-28 NOTE — Assessment & Plan Note (Signed)
empirici Septra DS.

## 2014-08-29 LAB — URINE CULTURE: Colony Count: >=100000

## 2014-09-05 ENCOUNTER — Encounter: Payer: Self-pay | Admitting: *Deleted

## 2014-09-19 ENCOUNTER — Other Ambulatory Visit: Payer: Self-pay | Admitting: Internal Medicine

## 2015-02-06 ENCOUNTER — Encounter: Payer: Self-pay | Admitting: Internal Medicine

## 2015-02-06 ENCOUNTER — Ambulatory Visit (INDEPENDENT_AMBULATORY_CARE_PROVIDER_SITE_OTHER): Payer: 59 | Admitting: Internal Medicine

## 2015-02-06 ENCOUNTER — Encounter: Payer: Self-pay | Admitting: *Deleted

## 2015-02-06 VITALS — BP 118/74 | HR 102 | Temp 98.0°F | Resp 16 | Ht 64.0 in | Wt 293.1 lb

## 2015-02-06 DIAGNOSIS — J020 Streptococcal pharyngitis: Secondary | ICD-10-CM | POA: Insufficient documentation

## 2015-02-06 DIAGNOSIS — J029 Acute pharyngitis, unspecified: Secondary | ICD-10-CM | POA: Diagnosis not present

## 2015-02-06 LAB — POCT RAPID STREP A (OFFICE): RAPID STREP A SCREEN: NEGATIVE

## 2015-02-06 MED ORDER — AMOXICILLIN-POT CLAVULANATE 400-57 MG/5ML PO SUSR: 800.0000 mg | Freq: Two times a day (BID) | ORAL | Status: DC

## 2015-02-06 MED ORDER — IBUPROFEN 600 MG PO TABS: 600.0000 mg | ORAL_TABLET | Freq: Three times a day (TID) | ORAL | Status: DC | PRN

## 2015-02-06 NOTE — Progress Notes (Signed)
Pre visit review using our clinic review tool, if applicable. No additional management support is needed unless otherwise documented below in the visit note. 

## 2015-02-06 NOTE — Progress Notes (Signed)
Subjective:  Patient ID: Julie Hammond, female    DOB: 1990/12/29  Age: 24 y.o. MRN: [id number]  CC: The primary encounter diagnosis was Strep pharyngitis. A diagnosis of Acute pharyngitis, unspecified etiology was also pertinent to this visit.  HPI [name] presents for pharyngitis of 2 days duration ,  Getting worse, some cervical  Lymphadenopathy Hurts to swallow anything but liquids.   Brothers have both been sick ,  One had strep pharyngitis.       Outpatient Prescriptions Prior to Visit  Medication Sig Dispense Refill  . atenolol (TENORMIN) 50 MG tablet TAKE ONE AND ONE-HALF TABLETS BY MOUTH TWICE DAILY  90 tablet 5  . Cholecalciferol (VITAMIN D3) 1000 UNITS CAPS Take 1 capsule by mouth daily.    Marland Kitchen lamoTRIgine (LAMICTAL) 25 MG tablet Take 2 tablets (50 mg total) by mouth daily. 60 tablet 5  . Multiple Vitamin (MULTIVITAMIN) tablet Take 1 tablet by mouth daily.    . Phenazopyrid-Cranbry-C-Probiot (AZO URINARY TRACT SUPPORT PO) Take 2 tablets by mouth 3 (three) times daily.    Marland Kitchen venlafaxine XR (EFFEXOR-XR) 75 MG 24 hr capsule TAKE THREE CAPSULES BY MOUTH DAILY WITH BREAKFAST 90 capsule 1  . sulfamethoxazole-trimethoprim (BACTRIM DS,SEPTRA DS) 800-160 MG per tablet Take 1 tablet by mouth 2 (two) times daily. 6 tablet 0   No facility-administered medications prior to visit.    Review of Systems;  Patient denies headache, fevers, malaise, unintentional weight loss, skin rash, eye pain, sinus congestion and sinus pain, sore throat, dysphagia,  hemoptysis , cough, dyspnea, wheezing, chest pain, palpitations, orthopnea, edema, abdominal pain, nausea, melena, diarrhea, constipation, flank pain, dysuria, hematuria, urinary  Frequency, nocturia, numbness, tingling, seizures,  Focal weakness, Loss of consciousness,  Tremor, insomnia, depression, anxiety, and suicidal ideation.      Objective:  BP 118/74 mmHg  Pulse 102  Temp(Src) 98 F (36.7 C) (Oral)  Resp 16  Ht 5'  4" (1.626 m)  Wt 293 lb 1.9 oz (132.958 kg)  BMI 50.29 kg/m2  SpO2 97%  LMP 02/01/2015  BP Readings from Last 3 Encounters:  02/06/15 118/74  08/26/14 128/78  05/25/14 112/78    Wt Readings from Last 3 Encounters:  02/06/15 293 lb 1.9 oz (132.958 kg)  08/26/14 264 lb 12 oz (120.09 kg)  05/25/14 276 lb 12 oz (125.533 kg)    HEENT: Head: Normocephalic, without obvious abnormality, atraumatic, sinuses tender to percussion Eyes: conjunctivae/corneas clear. PERRL, EOM's intact. Fundi benign. Ears: normal TM's and external ear canals both ears Nose: Nares normal. Septum midline. Mucosa injected and red . Sinuses nontender  Throat: lips, mucosa, and tongue normal; teeth and gums normal. Bilateral tonsillar erythema and edema  Back: symmetric, no curvature. ROM normal. No CVA tenderness. Lungs: clear to auscultation bilaterally Heart: regular rate and rhythm, S1, S2 normal, no murmur, click, rub or gallop Abdomen: soft, non-tender; bowel sounds normal; no masses,  no organomegaly Pulses: 2+ and symmetric Skin: Skin color, texture, turgor normal. No rashes or lesions Lymph nodes: Cervical, supraclavicular, and axillary nodes normal.  Lab Results  Component Value Date   HGBA1C 4.7 10/21/2013    Lab Results  Component Value Date   CREATININE 0.62 08/26/2014   CREATININE 0.7 10/21/2013   CREATININE 0.7 11/28/2011    Lab Results  Component Value Date   GLUCOSE 58* 08/26/2014   CHOL 144 11/28/2011   TRIG 53.0 11/28/2011   HDL 43.50 11/28/2011   LDLCALC 90 11/28/2011   ALT 18 08/26/2014   AST 17  08/26/2014   NA 142 08/26/2014   K 4.2 08/26/2014   CL 104 08/26/2014   CREATININE 0.62 08/26/2014   BUN 13 08/26/2014   CO2 25 08/26/2014   TSH 2.22 11/28/2011   HGBA1C 4.7 10/21/2013    No results found.  Assessment & Plan:   Problem List Items Addressed This Visit    Acute pharyngitis    Rapid strep test was negative. However, exam suggests bacterial infection.  Empiric  augmentin oral suspension.  ibiuprofen       Strep pharyngitis - Primary   Relevant Orders   POCT rapid strep A (Completed)      I have discontinued Ms. Feitchtinger's sulfamethoxazole-trimethoprim. I am also having her start on amoxicillin-clavulanate and ibuprofen. Additionally, I am having her maintain her multivitamin, Vitamin D3, atenolol, Phenazopyrid-Cranbry-C-Probiot (AZO URINARY TRACT SUPPORT PO), lamoTRIgine, and venlafaxine XR.  Meds ordered this encounter  Medications  . amoxicillin-clavulanate (AUGMENTIN) 400-57 MG/5ML suspension    Sig: Take 10 mLs (800 mg total) by mouth 2 (two) times daily.    Dispense:  150 mL    Refill:  0  . ibuprofen (ADVIL,MOTRIN) 600 MG tablet    Sig: Take 1 tablet (600 mg total) by mouth every 8 (eight) hours as needed.    Dispense:  30 tablet    Refill:  0    Medications Discontinued During This Encounter  Medication Reason  . sulfamethoxazole-trimethoprim (BACTRIM DS,SEPTRA DS) 800-160 MG per tablet Completed Course    Follow-up: No Follow-up on file.   Sherlene ShamsULLO, Janeva Peaster L, MD

## 2015-02-06 NOTE — Patient Instructions (Signed)
I am treating you for strep throat, even thought the rapid strep test is negative   Liquid augmentin 10 ml every 12 hours for 7 days  Motrin 600 mg every 8 hours as needed   I advise staying out of work one day to rest.  Soft diet   Gargle with salt water to help shrink your LARGE SWOLLEN tonsils

## 2015-02-07 NOTE — Assessment & Plan Note (Signed)
Rapid strep test was negative. However, exam suggests bacterial infection.  Empiric augmentin oral suspension.  ibiuprofen

## 2015-03-03 ENCOUNTER — Other Ambulatory Visit: Payer: Self-pay | Admitting: Internal Medicine

## 2015-07-09 ENCOUNTER — Other Ambulatory Visit: Payer: Self-pay | Admitting: Internal Medicine

## 2015-07-11 NOTE — Telephone Encounter (Signed)
Pt requesting a refill. Last OV 08/26/14, last filled 05/22/15. Ok to refill?

## 2015-07-12 NOTE — Telephone Encounter (Signed)
REFILLED,  NEEDS OV IN June  

## 2015-12-08 ENCOUNTER — Ambulatory Visit: Payer: 59 | Admitting: Family

## 2015-12-11 ENCOUNTER — Ambulatory Visit (INDEPENDENT_AMBULATORY_CARE_PROVIDER_SITE_OTHER): Payer: 59 | Admitting: Family

## 2015-12-11 ENCOUNTER — Other Ambulatory Visit (HOSPITAL_COMMUNITY)
Admission: RE | Admit: 2015-12-11 | Discharge: 2015-12-11 | Disposition: A | Discharge status: Home / Self Care | Payer: 59 | Source: Ambulatory Visit | Attending: Family | Admitting: Family

## 2015-12-11 ENCOUNTER — Encounter: Payer: Self-pay | Admitting: Family

## 2015-12-11 VITALS — BP 128/88 | HR 109 | Temp 98.1°F | Ht 63.0 in | Wt 305.0 lb

## 2015-12-11 DIAGNOSIS — Z23 Encounter for immunization: Secondary | ICD-10-CM

## 2015-12-11 DIAGNOSIS — Z1151 Encounter for screening for human papillomavirus (HPV): Secondary | ICD-10-CM | POA: Insufficient documentation

## 2015-12-11 DIAGNOSIS — Z01419 Encounter for gynecological examination (general) (routine) without abnormal findings: Secondary | ICD-10-CM | POA: Diagnosis present

## 2015-12-11 DIAGNOSIS — N938 Other specified abnormal uterine and vaginal bleeding: Secondary | ICD-10-CM

## 2015-12-11 LAB — HEMOGLOBIN A1C: Hgb A1c MFr Bld: 5.2 % (ref 4.6–6.5)

## 2015-12-11 LAB — CBC WITH DIFFERENTIAL/PLATELET
BASOS PCT: 0 % (ref 0.0–3.0)
Basophils Absolute: 0 10*3/uL (ref 0.0–0.1)
EOS PCT: 0.8 % (ref 0.0–5.0)
Eosinophils Absolute: 0 10*3/uL (ref 0.0–0.7)
HCT: 29 % — ABNORMAL LOW (ref 36.0–46.0)
Hemoglobin: 8.8 g/dL — ABNORMAL LOW (ref 12.0–15.0)
LYMPHS ABS: 1.5 10*3/uL (ref 0.7–4.0)
Lymphocytes Relative: 25.5 % (ref 12.0–46.0)
MCHC: 30.4 g/dL (ref 30.0–36.0)
MONO ABS: 0.3 10*3/uL (ref 0.1–1.0)
Monocytes Relative: 5.4 % (ref 3.0–12.0)
NEUTROS ABS: 4 10*3/uL (ref 1.4–7.7)
NEUTROS PCT: 68.3 % (ref 43.0–77.0)
PLATELETS: 359 10*3/uL (ref 150.0–400.0)
RBC: 5.15 Mil/uL — AB (ref 3.87–5.11)
RDW: 19.7 % — AB (ref 11.5–15.5)
WBC: 5.8 10*3/uL (ref 4.0–10.5)

## 2015-12-11 LAB — COMPREHENSIVE METABOLIC PANEL
ALT: 26 U/L (ref 0–35)
AST: 20 U/L (ref 0–37)
Albumin: 3.9 g/dL (ref 3.5–5.2)
Alkaline Phosphatase: 38 U/L — ABNORMAL LOW (ref 39–117)
BUN: 11 mg/dL (ref 6–23)
CHLORIDE: 105 meq/L (ref 96–112)
CO2: 26 mEq/L (ref 19–32)
Calcium: 9.1 mg/dL (ref 8.4–10.5)
Creatinine, Ser: 0.6 mg/dL (ref 0.40–1.20)
GFR: 130.33 mL/min (ref >60.00)
GLUCOSE: 83 mg/dL (ref 70–99)
POTASSIUM: 3.8 meq/L (ref 3.5–5.1)
SODIUM: 138 meq/L (ref 135–145)
Total Bilirubin: 0.3 mg/dL (ref 0.2–1.2)
Total Protein: 7.6 g/dL (ref 6.0–8.3)

## 2015-12-11 LAB — TSH: TSH: 1.05 u[IU]/mL (ref 0.35–4.50)

## 2015-12-11 LAB — HM PAP SMEAR: HM Pap smear: NORMAL

## 2015-12-11 NOTE — Progress Notes (Signed)
Subjective:    Patient ID: Julie Hammond, female    DOB: Oct 07, 1990, 25 y.o.   MRN: [id number]  CC: [name] is a 25 y.o. female who presents today for an acute visit.    HPI: Patient is here for an acute visit with chief com of menorrhagia. She states she's been on her menstrual cycle for one month. Prior to had been on her menstrual cycle 2 months ago for one month.  For the first week of cycle, flow was normal and then the second week, she had spotting, and then got heavy again. Notes today it is spotty. Changing pad every 2-3 hours. No blood clots. She is not dizzy, lightheaded. Notes mild cramps in the beginning.  Cycle length is usually been first week of month from 2nd to 7th, sometimes heavy. Normal cycles. No h/o fibroids.  No concerns for STDs at this time. No new sexual partners. No fever, chills, no change in vaginal discharge, pelvic pain.  Over due for pap.   Notes school started back, no significant stress.   Has been on lamictal and effexor for one year.       HISTORY:  Past Medical History:  Diagnosis Date  . Allergy   . Anxiety   . Chicken pox   . Depression   . Syncope and collapse    History reviewed. No pertinent surgical history. Family History  Problem Relation Age of Onset  . Obesity Mother   . Diabetes Father   . Obesity Father     Allergies: Review of patient's allergies indicates no known allergies. Current Outpatient Prescriptions on File Prior to Visit  Medication Sig Dispense Refill  . atenolol (TENORMIN) 50 MG tablet TAKE ONE AND ONE-HALF TABLETS BY MOUTH TWICE DAILY  90 tablet 5  . Cholecalciferol (VITAMIN D3) 1000 UNITS CAPS Take 1 capsule by mouth daily.    Marland Kitchen. ibuprofen (ADVIL,MOTRIN) 600 MG tablet Take 1 tablet (600 mg total) by mouth every 8 (eight) hours as needed. 30 tablet 0  . lamoTRIgine (LAMICTAL) 25 MG tablet Take 2 tablets (50 mg total) by mouth daily. 60 tablet 5  . Multiple Vitamin (MULTIVITAMIN) tablet Take  1 tablet by mouth daily.    . Phenazopyrid-Cranbry-C-Probiot (AZO URINARY TRACT SUPPORT PO) Take 2 tablets by mouth 3 (three) times daily.    Marland Kitchen. venlafaxine XR (EFFEXOR-XR) 75 MG 24 hr capsule TAKE THREE CAPSULES BY MOUTH DAILY WITH BREAKFAST 90 capsule 0   No current facility-administered medications on file prior to visit.     Social History  Substance Use Topics  . Smoking status: Never Smoker  . Smokeless tobacco: Never Used  . Alcohol use No    Review of Systems  Constitutional: Negative for chills and fever.  Respiratory: Negative for cough.   Cardiovascular: Negative for chest pain and palpitations.  Gastrointestinal: Negative for abdominal distention, abdominal pain, nausea and vomiting.  Genitourinary: Positive for vaginal bleeding. Negative for decreased urine volume, dysuria, flank pain, vaginal discharge and vaginal pain.  Neurological: Negative for dizziness and headaches.      Objective:    BP 128/88   Pulse (!) 109   Temp 98.1 F (36.7 C) (Oral)   Ht 5\' 3"  (1.6 m)   Wt (!) 305 lb (138.3 kg)   LMP  (LMP Unknown)   SpO2 98%   BMI 54.03 kg/m    Physical Exam  Constitutional: She appears well-developed and well-nourished.  Eyes: Conjunctivae are normal.  Cardiovascular: Normal rate, regular rhythm, normal  heart sounds and normal pulses.   Pulmonary/Chest: Effort normal and breath sounds normal. She has no wheezes. She has no rhonchi. She has no rales.  Abdominal: There is no tenderness.  Genitourinary: There is no rash, tenderness, lesion or injury on the right labia. There is no rash, tenderness, lesion or injury on the left labia. There is bleeding in the vagina. No erythema or tenderness in the vagina. No foreign body in the vagina. No vaginal discharge found.  Genitourinary Comments: Pap performed. Blood seen coming from cervix. No friable lesions appreciated. No clots, purulent or thick white  Discharge. No CMT.   Neurological: She is alert.  Skin: Skin is  warm and dry.  Psychiatric: She has a normal mood and affect. Her speech is normal and behavior is normal. Thought content normal.  Vitals reviewed.      Assessment & Plan:  1. Encounter for immunization  - Flu Vaccine QUAD 36+ mos IM  2. Dysfunctional uterine bleeding Etiology is unknown at this time. Reassured by painless nature of menstrual cycle.No pelbic pain to suggest PID. Working diagnoses of hormonal dysfunction including thyroid, diabetes. Also considering fibroids,ovulatory dysfunction  Pending blood count to ensure patient is not profoundly anemic; reassured she is asymptomatic. Attempted pap today however results may not be resulted due to amount of blood coming from vagina. If work up unrevealing, patient and I discussed consult with GYN.  - Comprehensive metabolic panel - CBC with Differential/Platelet - hCG, serum, qualitative - Hemoglobin A1c - Cytology - PAP - TSH     I have discontinued Ms. Burry's amoxicillin-clavulanate. I am also having her maintain her multivitamin, Vitamin D3, atenolol, Phenazopyrid-Cranbry-C-Probiot (AZO URINARY TRACT SUPPORT PO), lamoTRIgine, ibuprofen, and venlafaxine XR.   No orders of the defined types were placed in this encounter.   Return precautions given.   Risks, benefits, and alternatives of the medications and treatment plan prescribed today were discussed, and patient expressed understanding.   Education regarding symptom management and diagnosis given to patient on AVS.  Continue to follow with TULLO, Mar Daring, MD for routine health maintenance.   Louvina Aldrete and I agreed with plan.   Rennie Plowman, FNP

## 2015-12-11 NOTE — Patient Instructions (Signed)
Lab work; will notify you with results.  If lab work unrevealing, we will send you to GYN for further evaluation.   If there is no improvement in your symptoms, or if there is any worsening of symptoms, or if you have any additional concerns, please return for re-evaluation; or, if we are closed, consider going to the Emergency Room for evaluation if symptoms urgent.

## 2015-12-11 NOTE — Progress Notes (Signed)
Pre visit review using our clinic review tool, if applicable. No additional management support is needed unless otherwise documented below in the visit note. 

## 2015-12-12 ENCOUNTER — Other Ambulatory Visit: Payer: Self-pay | Admitting: Family

## 2015-12-12 DIAGNOSIS — N938 Other specified abnormal uterine and vaginal bleeding: Secondary | ICD-10-CM

## 2015-12-12 LAB — CYTOLOGY - PAP

## 2015-12-12 LAB — HCG, SERUM, QUALITATIVE: Preg, Serum: NEGATIVE

## 2015-12-12 NOTE — Progress Notes (Signed)
Has been on lamictal for years without DUB. Etiology unclear at this time. Consult placed to GYN.

## 2016-01-04 ENCOUNTER — Ambulatory Visit (INDEPENDENT_AMBULATORY_CARE_PROVIDER_SITE_OTHER): Payer: 59 | Admitting: Obstetrics and Gynecology

## 2016-01-04 ENCOUNTER — Encounter: Payer: Self-pay | Admitting: Obstetrics and Gynecology

## 2016-01-04 VITALS — BP 126/84 | HR 103 | Ht 63.0 in | Wt 306.8 lb

## 2016-01-04 DIAGNOSIS — D500 Iron deficiency anemia secondary to blood loss (chronic): Secondary | ICD-10-CM

## 2016-01-04 DIAGNOSIS — N921 Excessive and frequent menstruation with irregular cycle: Secondary | ICD-10-CM | POA: Diagnosis not present

## 2016-01-04 DIAGNOSIS — E6609 Other obesity due to excess calories: Secondary | ICD-10-CM | POA: Diagnosis not present

## 2016-01-04 DIAGNOSIS — N946 Dysmenorrhea, unspecified: Secondary | ICD-10-CM | POA: Diagnosis not present

## 2016-01-04 DIAGNOSIS — E282 Polycystic ovarian syndrome: Secondary | ICD-10-CM | POA: Diagnosis not present

## 2016-01-04 DIAGNOSIS — IMO0001 Reserved for inherently not codable concepts without codable children: Secondary | ICD-10-CM

## 2016-01-04 DIAGNOSIS — Z6841 Body Mass Index (BMI) 40.0 and over, adult: Secondary | ICD-10-CM | POA: Diagnosis not present

## 2016-01-04 NOTE — Patient Instructions (Addendum)
1. Ultrasound is scheduled for next week 2. Return in 2 weeks for follow-up and endometrial biopsy 3. Blood work will be obtained at next visit 4. Recommend iron supplementation daily for assistance with anemia  Endometrial Biopsy Endometrial biopsy is a procedure in which a tissue sample is taken from inside the uterus. The tissue sample is then looked at under a microscope to see if the tissue is normal or abnormal. The endometrium is the lining of the uterus. This procedure helps determine where you are in your menstrual cycle and how hormone levels are affecting the lining of the uterus. This procedure may also be used to evaluate uterine bleeding or to diagnose endometrial cancer, tuberculosis, polyps, or inflammatory conditions.  [facility] KNOW ABOUT:  Any allergies you have.  All medicines you are taking, including vitamins, herbs, eye drops, creams, and over-the-counter medicines.  Previous problems you or members of your family have had with the use of anesthetics.  Any blood disorders you have.  Previous surgeries you have had.  Medical conditions you have.  Possibility of pregnancy. RISKS AND COMPLICATIONS Generally, this is a safe procedure. However, as with any procedure, complications can occur. Possible complications include:  Bleeding.  Pelvic infection.  Puncture of the uterine wall with the biopsy device (rare). BEFORE THE PROCEDURE   Keep a record of your menstrual cycles as directed by your health care provider. You may need to schedule your procedure for a specific time in your cycle.  You may want to bring a sanitary pad to wear home after the procedure.  Arrange for someone to drive you home after the procedure if you will be given a medicine to help you relax (sedative). PROCEDURE   You may be given a sedative to relax you.  You will lie on an exam table with your feet and legs supported as in a pelvic exam.  Your health care  provider will insert an instrument (speculum) into your vagina to see your cervix.  Your cervix will be cleansed with an antiseptic solution. A medicine (local anesthetic) will be used to numb the cervix.  A forceps instrument (tenaculum) will be used to hold your cervix steady for the biopsy.  A thin, rodlike instrument (uterine sound) will be inserted through your cervix to determine the length of your uterus and the location where the biopsy sample will be removed.  A thin, flexible tube (catheter) will be inserted through your cervix and into the uterus. The catheter is used to collect the biopsy sample from your endometrial tissue.  The catheter and speculum will then be removed, and the tissue sample will be sent to a lab for examination. AFTER THE PROCEDURE  You will rest in a recovery area until you are ready to go home.  You may have mild cramping and a small amount of vaginal bleeding for a few days after the procedure. This is normal.  Make sure you find out how to get your test results.   This information is not intended to replace advice given to you by your health care provider. Make sure you discuss any questions you have with your health care provider.   Document Released: 06/28/2004 Document Revised: 10/28/2012 Document Reviewed: 08/12/2012 Elsevier Interactive Patient Education 2016 Elsevier Inc.    Polycystic Ovarian Syndrome Polycystic ovarian syndrome (PCOS) is a common hormonal disorder among women of reproductive age. Most women with PCOS grow many small cysts on their ovaries. PCOS can cause problems with your  periods and make it difficult to get pregnant. It can also cause an increased risk of miscarriage with pregnancy. If left untreated, PCOS can lead to serious health problems, such as diabetes and heart disease. CAUSES The cause of PCOS is not fully understood, but genetics may be a factor. SIGNS AND SYMPTOMS   Infrequent or no menstrual periods.    Inability to get pregnant (infertility) because of not ovulating.   Increased growth of hair on the face, chest, stomach, back, thumbs, thighs, or toes.   Acne, oily skin, or dandruff.   Pelvic pain.   Weight gain or obesity, usually carrying extra weight around the waist.   Type 2 diabetes.   High cholesterol.   High blood pressure.   Female-pattern baldness or thinning hair.   Patches of thickened and dark brown or black skin on the neck, arms, breasts, or thighs.   Tiny excess flaps of skin (skin tags) in the armpits or neck area.   Excessive snoring and having breathing stop at times while asleep (sleep apnea).   Deepening of the voice.   Gestational diabetes when pregnant.  DIAGNOSIS  There is no single test to diagnose PCOS.   Your health care provider will:   Take a medical history.   Perform a pelvic exam.   Have ultrasonography done.   Check your female and female hormone levels.   Measure glucose or sugar levels in the blood.   Do other blood tests.   If you are producing too many female hormones, your health care provider will make sure it is from PCOS. At the physical exam, your health care provider will want to evaluate the areas of increased hair growth. Try to allow natural hair growth for a few days before the visit.   During a pelvic exam, the ovaries may be enlarged or swollen because of the increased number of small cysts. This can be seen more easily by using vaginal ultrasonography or screening to examine the ovaries and lining of the uterus (endometrium) for cysts. The uterine lining may become thicker if you have not been having a regular period.  TREATMENT  Because there is no cure for PCOS, it needs to be managed to prevent problems. Treatments are based on your symptoms. Treatment is also based on whether you want to have a baby or whether you need contraception.  Treatment may include:   Progesterone hormone to  start a menstrual period.   Birth control pills to make you have regular menstrual periods.   Medicines to make you ovulate, if you want to get pregnant.   Medicines to control your insulin.   Medicine to control your blood pressure.   Medicine and diet to control your high cholesterol and triglycerides in your blood.  Medicine to reduce excessive hair growth.  Surgery, making small holes in the ovary, to decrease the amount of female hormone production. This is done through a long, lighted tube (laparoscope) placed into the pelvis through a tiny incision in the lower abdomen.  HOME CARE INSTRUCTIONS  Only take over-the-counter or prescription medicine as directed by your health care provider.  Pay attention to the foods you eat and your activity levels. This can help reduce the effects of PCOS.  Keep your weight under control.  Eat foods that are low in carbohydrate and high in fiber.  Exercise regularly. SEEK MEDICAL CARE IF:  Your symptoms do not get better with medicine.  You have new symptoms.   This information is  not intended to replace advice given to you by your health care provider. Make sure you discuss any questions you have with your health care provider.   Document Released: 06/21/2004 Document Revised: 12/16/2012 Document Reviewed: 08/13/2012 Elsevier Interactive Patient Education 2016 Elsevier Inc. 1. Pelvic ultrasound is ordered 2. Return in 1 week after pelvic ultrasound for endometrial biopsy 3. Obtain iron supplementation and take it daily or twice a day 4. We will likely institute birth control pill therapy for management of menorrhagia and polycystic ovary disease   Polycystic Ovarian Syndrome Polycystic ovarian syndrome (PCOS) is a common hormonal disorder among women of reproductive age. Most women with PCOS grow many small cysts on their ovaries. PCOS can cause problems with your periods and make it difficult to get pregnant. It can also cause  an increased risk of miscarriage with pregnancy. If left untreated, PCOS can lead to serious health problems, such as diabetes and heart disease. CAUSES The cause of PCOS is not fully understood, but genetics may be a factor. SIGNS AND SYMPTOMS   Infrequent or no menstrual periods.   Inability to get pregnant (infertility) because of not ovulating.   Increased growth of hair on the face, chest, stomach, back, thumbs, thighs, or toes.   Acne, oily skin, or dandruff.   Pelvic pain.   Weight gain or obesity, usually carrying extra weight around the waist.   Type 2 diabetes.   High cholesterol.   High blood pressure.   Female-pattern baldness or thinning hair.   Patches of thickened and dark brown or black skin on the neck, arms, breasts, or thighs.   Tiny excess flaps of skin (skin tags) in the armpits or neck area.   Excessive snoring and having breathing stop at times while asleep (sleep apnea).   Deepening of the voice.   Gestational diabetes when pregnant.  DIAGNOSIS  There is no single test to diagnose PCOS.   Your health care provider will:   Take a medical history.   Perform a pelvic exam.   Have ultrasonography done.   Check your female and female hormone levels.   Measure glucose or sugar levels in the blood.   Do other blood tests.   If you are producing too many female hormones, your health care provider will make sure it is from PCOS. At the physical exam, your health care provider will want to evaluate the areas of increased hair growth. Try to allow natural hair growth for a few days before the visit.   During a pelvic exam, the ovaries may be enlarged or swollen because of the increased number of small cysts. This can be seen more easily by using vaginal ultrasonography or screening to examine the ovaries and lining of the uterus (endometrium) for cysts. The uterine lining may become thicker if you have not been having a regular  period.  TREATMENT  Because there is no cure for PCOS, it needs to be managed to prevent problems. Treatments are based on your symptoms. Treatment is also based on whether you want to have a baby or whether you need contraception.  Treatment may include:   Progesterone hormone to start a menstrual period.   Birth control pills to make you have regular menstrual periods.   Medicines to make you ovulate, if you want to get pregnant.   Medicines to control your insulin.   Medicine to control your blood pressure.   Medicine and diet to control your high cholesterol and triglycerides in your blood.  Medicine to reduce excessive hair growth.  Surgery, making small holes in the ovary, to decrease the amount of female hormone production. This is done through a long, lighted tube (laparoscope) placed into the pelvis through a tiny incision in the lower abdomen.  HOME CARE INSTRUCTIONS  Only take over-the-counter or prescription medicine as directed by your health care provider.  Pay attention to the foods you eat and your activity levels. This can help reduce the effects of PCOS.  Keep your weight under control.  Eat foods that are low in carbohydrate and high in fiber.  Exercise regularly. SEEK MEDICAL CARE IF:  Your symptoms do not get better with medicine.  You have new symptoms.   This information is not intended to replace advice given to you by your health care provider. Make sure you discuss any questions you have with your health care provider.   Document Released: 06/21/2004 Document Revised: 12/16/2012 Document Reviewed: 08/13/2012 Elsevier Interactive Patient Education [employer]

## 2016-01-04 NOTE — Progress Notes (Signed)
GYN ENCOUNTER NOTE  Subjective:       Julie Hammond is a 25 y.o. G0P0000 female is here for gynecologic evaluation of the following issues:   1. Abnormal uterine bleeding- Pt has been menstruating since the end of July. Bleeding lightens up a couple days a week, varies between heavy and light bleeding and does not have clots. Patient has never been sexually active. Patient is not on BCP and has never received hormones. Pap smear normal last month. Denies galactorrhea, abnormal hair growth or any other new health issues.   Menstrual History Menarche- 25yo Interval- 28 days to 3 months with no BTB Duration- 5-7 days Dysmenorrhea- central, lateral and lower back pain, takes ibuprofen 600 BID  2. Anemia- Hgb- 8.8, Hct- 29, MCV- 29.0 obtained on 12/11/2015- Pt is not experiencing lightheadedness or heart palpitations. Notes mild fatigue. Was instructed to begin iron supplementation at last PCP visit but she has not begun therapy.     Gynecologic History Patient's last menstrual period was 01/04/2016 (exact date). Last Pap: 12/11/2015. Results were: normal   Obstetric History OB History  Gravida Para Term Preterm AB Living  0 0 0 0 0 0  SAB TAB Ectopic Multiple Live Births  0 0 0 0 0        Past Medical History:  Diagnosis Date  . Allergy   . Anxiety   . Chicken pox   . Depression   . Hypertension   . Syncope and collapse     Past Surgical History:  Procedure Laterality Date  . NO PAST SURGERIES      Current Outpatient Prescriptions on File Prior to Visit  Medication Sig Dispense Refill  . atenolol (TENORMIN) 50 MG tablet TAKE ONE AND ONE-HALF TABLETS BY MOUTH TWICE DAILY  90 tablet 5  . Cholecalciferol (VITAMIN D3) 1000 UNITS CAPS Take 1 capsule by mouth daily.    Marland Kitchen ibuprofen (ADVIL,MOTRIN) 600 MG tablet Take 1 tablet (600 mg total) by mouth every 8 (eight) hours as needed. 30 tablet 0  . lamoTRIgine (LAMICTAL) 25 MG tablet Take 2 tablets (50 mg total) by mouth  daily. 60 tablet 5  . Multiple Vitamin (MULTIVITAMIN) tablet Take 1 tablet by mouth daily.    Marland Kitchen venlafaxine XR (EFFEXOR-XR) 75 MG 24 hr capsule TAKE THREE CAPSULES BY MOUTH DAILY WITH BREAKFAST 90 capsule 0   No current facility-administered medications on file prior to visit.     No Known Allergies  Social History   Social History  . Marital status: Single    Spouse name: N/A  . Number of children: N/A  . Years of education: N/A   Occupational History  . Not on file.   Social History Main Topics  . Smoking status: Never Smoker  . Smokeless tobacco: Never Used  . Alcohol use Yes     Comment: occas  . Drug use: No  . Sexual activity: No   Other Topics Concern  . Not on file   Social History Narrative  . No narrative on file    Family History  Problem Relation Age of Onset  . Obesity Mother   . Obesity Father   . Breast cancer Neg Hx   . Ovarian cancer Neg Hx   . Colon cancer Neg Hx   . Heart disease Neg Hx     The following portions of the patient's history were reviewed and updated as appropriate: allergies, current medications, past family history, past medical history, past social history, past surgical history  and problem list.  Review of Systems Review of Systems -Per history of present illness  Objective:   BP 126/84   Pulse (!) 103   Ht 5\' 3"  (1.6 m)   Wt (!) 306 lb 12.8 oz (139.2 kg)   LMP 01/04/2016 (Exact Date)   BMI 54.35 kg/m  CONSTITUTIONAL: Well-developed, well-nourished female in no acute distress.  HENT:  Normocephalic, atraumatic. Increased facial hair on upper lip NECK: Normal range of motion, supple, no masses.  Normal thyroid.  SKIN: Skin is warm and dry. No rash noted. Not diaphoretic. No erythema. No pallor. NEUROLGIC: Alert and oriented to person, place, and time. PSYCHIATRIC: Normal mood and affect. Normal behavior. Normal judgment and thought content. CARDIOVASCULAR: RRR, no murmurs, rubs or gallops RESPIRATORY: CTABL, no  accessory muscle use BREASTS: Not Examined ABDOMEN: Soft, non distended; Non tender.  No Organomegaly. PELVIC:  External Genitalia: Normal  BUS: Normal  Vagina: Normal; minimal blood in vault   Cervix: Normal; no lesions; no cervical motion tenderness   Uterus:  size difficult to assess due to body habitus; nontender; midplane   Adnexa: Normal; nonpalpable and nontender   RV: Normal  external exam   Bladder: Nontender MUSCULOSKELETAL: Normal range of motion. No tenderness.  No cyanosis, clubbing, or edema.     Assessment:  Abnormal Uterine Bleeding(Menorrhagia with irregular cycle and (: Differential diagnosis includes PCOS, fibroids, and endometrial hyperplasia.)    PCO,  likely due to history of long intervals between menstruation, BMI- 54.35, mild hair growth on face and abdomen. Will obtain pelvic US to assist in diagnosis.   Anemia due to hemorrhagia: Hgb- 8.8, Hct- 29, MCV- 29.0 obtained on 12/11/2015- has not currently started iron supplementation, does feel fatigue but denies other symptoms.   TSH and HgbA1C wnl as of 12/11/2015.   Morbid obesity  Dysmenorrhea Plan:  1. Trans vaginal ultrasound- 1 week 2. Endometrial biopsy- 2 weeks 3. Iron supplementation daily 4. We'll likely institute OCP therapy to help regulate bleeding after studies are completed     Julie HerterAnna Parr, PA-S Herold HarmsMartin A Daksha Koone, MD   I have seen, interviewed, and examined the patient in conjunction with the [employer] and affirm the diagnosis and management plan. Luba Matzen A. Gayatri Teasdale, MD, FACOG   Note: This dictation was prepared with Dragon dictation along with smaller phrase technology. Any transcriptional errors that result from this process are unintentional.

## 2016-01-11 ENCOUNTER — Ambulatory Visit (INDEPENDENT_AMBULATORY_CARE_PROVIDER_SITE_OTHER): Payer: 59

## 2016-01-11 DIAGNOSIS — N921 Excessive and frequent menstruation with irregular cycle: Secondary | ICD-10-CM | POA: Diagnosis not present

## 2016-01-12 ENCOUNTER — Encounter: Payer: Self-pay | Admitting: Obstetrics and Gynecology

## 2016-01-15 ENCOUNTER — Encounter: Payer: Self-pay | Admitting: Obstetrics and Gynecology

## 2016-01-15 DIAGNOSIS — N83291 Other ovarian cyst, right side: Secondary | ICD-10-CM | POA: Insufficient documentation

## 2016-01-16 ENCOUNTER — Ambulatory Visit (INDEPENDENT_AMBULATORY_CARE_PROVIDER_SITE_OTHER): Payer: 59 | Admitting: Obstetrics and Gynecology

## 2016-01-16 ENCOUNTER — Encounter: Payer: Self-pay | Admitting: Obstetrics and Gynecology

## 2016-01-16 VITALS — BP 111/77 | HR 93 | Ht 64.0 in | Wt 309.5 lb

## 2016-01-16 DIAGNOSIS — N921 Excessive and frequent menstruation with irregular cycle: Secondary | ICD-10-CM

## 2016-01-16 DIAGNOSIS — N946 Dysmenorrhea, unspecified: Secondary | ICD-10-CM | POA: Diagnosis not present

## 2016-01-16 DIAGNOSIS — Z6841 Body Mass Index (BMI) 40.0 and over, adult: Secondary | ICD-10-CM

## 2016-01-16 DIAGNOSIS — N83299 Other ovarian cyst, unspecified side: Secondary | ICD-10-CM | POA: Diagnosis not present

## 2016-01-16 DIAGNOSIS — IMO0001 Reserved for inherently not codable concepts without codable children: Secondary | ICD-10-CM

## 2016-01-16 DIAGNOSIS — D500 Iron deficiency anemia secondary to blood loss (chronic): Secondary | ICD-10-CM | POA: Diagnosis not present

## 2016-01-16 DIAGNOSIS — E6609 Other obesity due to excess calories: Secondary | ICD-10-CM | POA: Diagnosis not present

## 2016-01-16 MED ORDER — NORETHIN ACE-ETH ESTRAD-FE 1.5-30 MG-MCG PO TABS: 1.0000 | ORAL_TABLET | Freq: Every day | ORAL | 11 refills | Status: DC

## 2016-01-16 NOTE — Patient Instructions (Signed)
1. Continue with iron supplementation daily 2. Start oral contraceptives daily (Junel 1.5/30) 3. Repeat pelvic ultrasound in 2 months 4. Maintain menstrual calendar monitoring with menstrual card 5. Return in 3 months for follow-up

## 2016-01-16 NOTE — Progress Notes (Signed)
Chief complaint: 1. Menorrhagia with irregular cycle 2. Iron deficiency anemia 3. Dysmenorrhea 4. Follow-up on ultrasound  Patient presents for follow-up on chronic abnormal uterine bleeding with anemia. Recent pelvic ultrasound demonstrates a 4.1 cm complex right ovarian cyst; endometrial stripe measures 3.4 mm.  Initial plan was for endometrial biopsy; however due to the limited thickness of the endometrial stripe, biopsy will be deferred  Past medical history, past surgical history, problem list, medications, and allergies are reviewed  OBJECTIVE: BP 111/77   Pulse 93   Ht 5\' 4"  (1.626 m)   Wt (!) 309 lb 8 oz (140.4 kg)   LMP 09/26/2015 (Approximate)   BMI 53.13 kg/m  Physical exam-deferred  ASSESSMENT: 1. Menorrhagia with irregular cycle 2. Iron deficiency anemia secondary to chronic blood loss 3. Dysmenorrhea 4. Complex right ovarian cyst 5. Class III obesity  PLAN: 1. Endometrial biopsy is deferred 2. Continue with iron supplementation daily 3. Start Junel FE 1/20 0.5/30 oral contraceptives 4. Maintain menstrual calendar monitoring 5. Repeat pelvic ultrasound in 2 months 6. Return in 3 months for follow-up  A total of 15 minutes were spent face-to-face with the patient during this encounter and over half of that time dealt with counseling and coordination of care.  Herold HarmsMartin A Eman Morimoto, MD  Note: This dictation was prepared with Dragon dictation along with smaller phrase technology. Any transcriptional errors that result from this process are unintentional.

## 2016-03-06 ENCOUNTER — Ambulatory Visit: Payer: 59 | Admitting: Family

## 2016-03-19 ENCOUNTER — Ambulatory Visit (INDEPENDENT_AMBULATORY_CARE_PROVIDER_SITE_OTHER): Payer: 59

## 2016-03-19 DIAGNOSIS — N83299 Other ovarian cyst, unspecified side: Secondary | ICD-10-CM | POA: Diagnosis not present

## 2016-03-19 DIAGNOSIS — N921 Excessive and frequent menstruation with irregular cycle: Secondary | ICD-10-CM | POA: Diagnosis not present

## 2016-03-19 DIAGNOSIS — D500 Iron deficiency anemia secondary to blood loss (chronic): Secondary | ICD-10-CM | POA: Diagnosis not present

## 2016-04-18 ENCOUNTER — Encounter: Payer: Self-pay | Admitting: Internal Medicine

## 2016-04-18 ENCOUNTER — Ambulatory Visit (INDEPENDENT_AMBULATORY_CARE_PROVIDER_SITE_OTHER): Payer: 59 | Admitting: Internal Medicine

## 2016-04-18 VITALS — BP 124/74 | HR 79 | Ht 63.0 in | Wt 306.5 lb

## 2016-04-18 DIAGNOSIS — I951 Orthostatic hypotension: Secondary | ICD-10-CM | POA: Diagnosis not present

## 2016-04-18 DIAGNOSIS — I498 Other specified cardiac arrhythmias: Secondary | ICD-10-CM

## 2016-04-18 DIAGNOSIS — G90A Postural orthostatic tachycardia syndrome (POTS): Secondary | ICD-10-CM

## 2016-04-18 DIAGNOSIS — R000 Tachycardia, unspecified: Secondary | ICD-10-CM

## 2016-04-18 NOTE — Progress Notes (Signed)
ELECTROPHYSIOLOGY CONSULT NOTE  Patient ID: Julie Hammond, MRN: [id number], DOB/AGE: [birth year] 25 y.o. Admit date: (Not on file) Date of Consult: 04/18/2016  Primary Physician: Sherlene Shams, MD Primary Cardiologist: *NA* Consulting Physician na  Chief Complaint: POTS   HPI Julie Hammond is a 26 y.o. female  Seen after a hiatus of 3-1/2 years. At that time her symptoms and assessment were consistent with POTS. She also had morbid obesity and has some degree of hypertension.  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  This and bending are the greatest challenge.   Volume status replete  BP elevated;  She may snore  She is not sure but maybe has PCOD     Past Medical History:  Diagnosis Date  . Allergy   . Anxiety   . Chicken pox   . Depression   . Hypertension   . POTS (postural orthostatic tachycardia syndrome)   . Syncope and collapse       Surgical History:  Past Surgical History:  Procedure Laterality Date  . NO PAST SURGERIES       Home Meds: Prior to Admission medications   Medication Sig Start Date End Date Taking? Authorizing Provider  atenolol (TENORMIN) 50 MG tablet TAKE ONE AND ONE-HALF TABLETS BY MOUTH TWICE DAILY  04/11/14  Yes Sherlene Shams, MD  Cholecalciferol (VITAMIN D3) 1000 UNITS CAPS Take 1 capsule by mouth daily.   Yes Historical Provider, MD  ibuprofen (ADVIL,MOTRIN) 600 MG tablet Take 1 tablet (600 mg total) by mouth every 8 (eight) hours as needed. 02/06/15  Yes Sherlene Shams, MD  lamoTRIgine (LAMICTAL) 25 MG tablet Take 2 tablets (50 mg total) by mouth daily. 08/26/14  Yes Sherlene Shams, MD  Multiple Vitamin (MULTIVITAMIN) tablet Take 1 tablet by mouth daily.   Yes Historical Provider, MD  norethindrone-ethinyl estradiol-iron (JUNEL FE 1.5/30) 1.5-30  MG-MCG tablet Take 1 tablet by mouth daily. 01/16/16  Yes Prentice Docker Defrancesco, MD  venlafaxine XR (EFFEXOR-XR) 75 MG 24 hr capsule TAKE THREE CAPSULES BY MOUTH DAILY WITH BREAKFAST 07/12/15  Yes Sherlene Shams, MD    Allergies: No Known Allergies  Social History   Social History  . Marital status: Single    Spouse name: N/A  . Number of children: N/A  . Years of education: N/A   Occupational History  . Not on file.   Social History Main Topics  . Smoking status: Never Smoker  . Smokeless tobacco: Never Used  . Alcohol use Yes     Comment: occas  . Drug use: No  . Sexual activity: No   Other Topics Concern  . Not on file   Social History Narrative  . No narrative on file     Family History  Problem Relation Age of Onset  . Obesity Mother   . Obesity Father   . Breast cancer Neg Hx   . Ovarian cancer Neg Hx   . Colon cancer Neg Hx   . Heart disease Neg Hx      ROS:  Please see the history of present illness.   All other systems reviewed and negative.    Physical Exam: Blood pressure 124/74, pulse 79, height 5\' 3"  (1.6 m), weight (!) 306 lb 8 oz (139 kg). General: Well developed, Morbidly obese  female  in no acute distress. Head: Normocephalic, atraumatic, sclera non-icteric, no xanthomas, nares are without discharge. EENT: normal  Lymph Nodes:  none Neck: Negative for carotid bruits. JVD not elevated. Back:without scoliosis kyphosis Lungs: Clear bilaterally to auscultation without wheezes, rales, or rhonchi. Breathing is unlabored. Heart: RRR with S1 S2. No murmur . No rubs, or gallops appreciated. Abdomen: Soft, non-tender, non-distended with normoactive bowel sounds. No hepatomegaly. No rebound/guarding. No obvious abdominal masses. Msk:  Strength and tone appear normal for age. Extremities: No clubbing or cyanosis. No  * edema.  Distal pedal pulses are 2+ and equal bilaterally. Skin: Warm and Dry Neuro: Alert and oriented X 3. CN III-XII intact Grossly normal  sensory and motor function . Psych:  Responds to questions appropriately with a normal affect.      Labs: Cardiac Enzymes No results for input(s): CKTOTAL, CKMB, TROPONINI in the last 72 hours. CBC Lab Results  Component Value Date   WBC 5.8 12/11/2015   HGB 8.8 Repeated and verified X2. (L) 12/11/2015   HCT 29.0 (L) 12/11/2015   MCV 56.3 Repeated and verified X2. (L) 12/11/2015   PLT 359.0 12/11/2015   PROTIME: No results for input(s): LABPROT, INR in the last 72 hours. Chemistry No results for input(s): NA, K, CL, CO2, BUN, CREATININE, CALCIUM, PROT, BILITOT, ALKPHOS, ALT, AST, GLUCOSE in the last 168 hours.  Invalid input(s): LABALBU Lipids Lab Results  Component Value Date   CHOL 144 11/28/2011   HDL 43.50 11/28/2011   LDLCALC 90 11/28/2011   TRIG 53.0 11/28/2011   BNP No results found for: PROBNP Thyroid Function Tests: No results for input(s): TSH, T4TOTAL, T3FREE, THYROIDAB in the last 72 hours.  Invalid input(s): FREET3 Miscellaneous No results found for: DDIMER  Radiology/Studies:  [place] Transvaginal Non-ob  Result Date: 03/22/2016 ULTRASOUND REPORT Location: [facility] Date of Service: 03/19/16 Indications:follow up right ovarian cyst and AUB Findings: The uterus measures 6.8 x 3.7 x 4.3 cm. Echo texture is homogenous without evidence of focal masses. Multiple nabothian cysts seen in cervix. The Endometrium measures 11.2 mm. Right Ovary measures 3.5 x 2.3 x 2.2 cm. It is normal in appearance. Left Ovary measures 3.8 x 2.5 x 2.7 cm. It is normal appearance. Survey of the adnexa demonstrates no adnexal masses. There is a small amount of free fluid in the cul de sac. Impression: 1. WNL Recommendations: 1.Clinical correlation with the patient's History and Physical Exam. Boyce MediciMaria E Hill Herold HarmsMartin A Defrancesco, MD    ZOX:WRUEAEKG:sinus with normal intervals   Assessment and Plan:  POTS  Elevated bloodpressure  Morbidly obese  Anemia   Heat and anemia are the  biggest issues at present  Her Fe replacement seems low and she is eating it with meals which further attenuates repletion benefit  Will check  Hgb and Ferritin  Have encouraged the use of abdominal binder   Consider sleep study  Sherryl MangesSteven Clarnce Homan

## 2016-04-18 NOTE — Patient Instructions (Signed)
Medication Instructions: - Your physician recommends that you continue on your current medications as directed. Please refer to the Current Medication list given to you today.  Labwork: - Your physician recommends that you have lab work today: CBC/ Ferritin  Procedures/Testing: - none ordered  Follow-Up: - Dr. Graciela HusbandsKlein will see you back on an as needed basis.  Any Additional Special Instructions Will Be Listed Below (If Applicable).     If you need a refill on your cardiac medications before your next appointment, please call your pharmacy.

## 2016-04-19 LAB — CBC WITH DIFFERENTIAL/PLATELET

## 2016-04-19 LAB — FERRITIN: FERRITIN: 6 ng/mL — AB (ref 15–150)

## 2016-04-20 ENCOUNTER — Telehealth: Payer: Self-pay | Admitting: Internal Medicine

## 2016-04-20 MED ORDER — FERROUS FUM-IRON POLYSACCH 162-115.2 MG PO CAPS: 1.0000 | ORAL_CAPSULE | Freq: Every day | ORAL | 3 refills | Status: DC

## 2016-04-20 NOTE — Telephone Encounter (Signed)
-----   Message from Jefferey PicaHeather C McGhee, RN sent at 04/19/2016 12:50 PM EST ----- I left a message of the patient's results on her identified voice mail. I advised I would forward to Dr. Darrick Huntsmanullo to review and if she has not heart from her office by middle of next week, to please call to follow up on recommendations for iron replacement.

## 2016-04-20 NOTE — Telephone Encounter (Signed)
Patient's cardiologist checked a cbc and noted that she was anemic. However  The labs  Reported are actually from October,  not  from feb 2,  so there is no way to tell yet if she needs more iron  Than the iron that is in the birth control that her gynecologist has given her. She will need to return for another blood draw .

## 2016-04-22 NOTE — Telephone Encounter (Signed)
Left message to call.

## 2016-04-23 ENCOUNTER — Encounter: Payer: Self-pay | Admitting: Obstetrics and Gynecology

## 2016-04-23 ENCOUNTER — Ambulatory Visit (INDEPENDENT_AMBULATORY_CARE_PROVIDER_SITE_OTHER): Payer: 59 | Admitting: Obstetrics and Gynecology

## 2016-04-23 ENCOUNTER — Telehealth: Payer: Self-pay | Admitting: Radiology

## 2016-04-23 VITALS — BP 115/76 | HR 93 | Ht 63.0 in | Wt 302.1 lb

## 2016-04-23 DIAGNOSIS — Z3041 Encounter for surveillance of contraceptive pills: Secondary | ICD-10-CM | POA: Diagnosis not present

## 2016-04-23 DIAGNOSIS — N946 Dysmenorrhea, unspecified: Secondary | ICD-10-CM

## 2016-04-23 DIAGNOSIS — D500 Iron deficiency anemia secondary to blood loss (chronic): Secondary | ICD-10-CM

## 2016-04-23 DIAGNOSIS — E282 Polycystic ovarian syndrome: Secondary | ICD-10-CM

## 2016-04-23 DIAGNOSIS — N921 Excessive and frequent menstruation with irregular cycle: Secondary | ICD-10-CM

## 2016-04-23 DIAGNOSIS — E6609 Other obesity due to excess calories: Secondary | ICD-10-CM

## 2016-04-23 DIAGNOSIS — N83299 Other ovarian cyst, unspecified side: Secondary | ICD-10-CM

## 2016-04-23 DIAGNOSIS — Z6841 Body Mass Index (BMI) 40.0 and over, adult: Secondary | ICD-10-CM

## 2016-04-23 DIAGNOSIS — IMO0001 Reserved for inherently not codable concepts without codable children: Secondary | ICD-10-CM

## 2016-04-23 MED ORDER — NORETHIN ACE-ETH ESTRAD-FE 1.5-30 MG-MCG PO TABS: 1.0000 | ORAL_TABLET | Freq: Every day | ORAL | 1 refills | Status: DC

## 2016-04-23 NOTE — Telephone Encounter (Signed)
Pt did not have her labs drawn at our office.

## 2016-04-23 NOTE — Telephone Encounter (Signed)
Did not know CBC was cancelled by [facility] due to no lavender tube being submitted. Note was in epic about no lavender tube being submitted but called [facility]

## 2016-04-23 NOTE — Telephone Encounter (Signed)
Did not know which lab you were referring too, our lab or [facility]. CBC was cancelled by [facility] due to no lavender tube being submitted by cardiology office. Note was in epic about no lavender tube being submitted under reason for test being cancelled. Called [facility] to confirm no submission. Spoke with Harlin HeysShara at [facility] and Iron and TIBC are being added as long as there is enough specimen left to be able to add tests. If there is not enough specimen to run test, [facility] will call and pt will need to come in to our office for lab draws.

## 2016-04-23 NOTE — Progress Notes (Signed)
Chief complaint: 1. Abnormal uterine bleeding-menorrhagia with irregular cycle 2. History of iron deficiency anemia 3. History of dysmenorrhea 4. History of complex ovarian cyst  Patient presents for follow-up after starting on oral contraceptives 3 months ago. She has noted improvement in her menorrhagia. After starting the pills in December she had no cycle. In January the patient had a 5 day menses with date to being heavy. In February patient has not had onset of menses as of yet. Patient is not experiencing any significant dysmenorrhea. Patient is not experiencing any anemia symptoms. Ultrasound in January 2018 demonstrated resolution of complex ovarian cyst: ULTRASOUND REPORT Location: [facility] Date of Service: 03/19/16 Indications:follow up right ovarian cyst and AUB Findings:  The uterus measures 6.8 x 3.7 x 4.3 cm. Echo texture is homogenous without evidence of focal masses. Multiple nabothian cysts seen in cervix. The Endometrium measures 11.2 mm. Right Ovary measures 3.5 x 2.3 x 2.2 cm. It is normal in appearance. Left Ovary measures 3.8 x 2.5 x 2.7 cm. It is normal appearance. Survey of the adnexa demonstrates no adnexal masses. There is a small amount of free fluid in the cul de sac. Impression: 1. WNL Recommendations: 1.Clinical correlation with the patient's History and Physical Exam.   Past medical history, past surgical history, problem list, medications, and allergies are reviewed  OBJECTIVE: BP 115/76   Pulse 93   Ht 5\' 3"  (1.6 m)   Wt (!) 302 lb 1.6 oz (137 kg)   LMP 04/01/2016   BMI 53.51 kg/m  Physical exam-deferred  ASSESSMENT: 1. Menorrhagia with irregular cycle, improved with start of OCP therapy 2. Dysmenorrhea, improved with start of OCP therapy 3. Iron deficiency anemia, asymptomatic since starting on see 4. History of complex ovarian cyst, now resolved on most recent ultrasound 5. Blood pressure normal on birth  PLAN: 1.  Continue with JuneL1.5/30 OCP 2. Maintain menstrual calendar monitoring 3. Return in 6 months for follow-up  A total of 15 minutes were spent face-to-face with the patient during this encounter and over half of that time dealt with counseling and coordination of care.  Herold HarmsMartin A Dominico Rod, MD  Note: This dictation was prepared with Dragon dictation along with smaller phrase technology. Any transcriptional errors that result from this process are unintentional.

## 2016-04-23 NOTE — Telephone Encounter (Signed)
Please clarify with the lab why the cbc was cancelled fro feb 8, patient needs tibc and cbc wi diff before I will increase her iron.

## 2016-04-23 NOTE — Telephone Encounter (Signed)
I KNOW THAT,  BUT I AM BEING TASKED BY CARDIOLOGY WITH RESPONDING TO THE LABS THAT WERE RESULTED ON FEB 8 !!!  IT APPEARS THAT THEY ARE NOT COMPLETE.  SO  UNLESS YOU CAN CLEAR UP  WITH [facility] WHETHER THE CBC WAS ACTUALLY DONE   WE WILL NEED TO ASK PATIENT TO RETURN FOR A CBC WI DIFF,  FERRITIN,  TIBC.   THANK YOU!!!!  DISREGARD ALL CAPS

## 2016-04-23 NOTE — Patient Instructions (Signed)
1. Continue with Junel 1.5/30 2. Continue with menstrual calendar monitoring 3. Return in 6 months for follow-up on abnormal uterine bleeding

## 2016-04-23 NOTE — Telephone Encounter (Signed)
THANK YOU

## 2016-04-23 NOTE — Telephone Encounter (Signed)
This was in regards to the ferritin level drawn on 04/18/16.  Thanks!

## 2016-04-23 NOTE — Telephone Encounter (Signed)
Pt labs were not drawn at this office.

## 2016-04-26 ENCOUNTER — Other Ambulatory Visit: Payer: Self-pay | Admitting: Internal Medicine

## 2016-04-26 DIAGNOSIS — D500 Iron deficiency anemia secondary to blood loss (chronic): Secondary | ICD-10-CM

## 2016-04-26 LAB — IRON AND TIBC
Iron Saturation: 3 % — CL (ref 15–55)
Iron: 12 ug/dL — ABNORMAL LOW (ref 27–159)
Total Iron Binding Capacity: 401 ug/dL (ref 250–450)
UIBC: 389 ug/dL (ref 131–425)

## 2016-04-26 LAB — SPECIMEN STATUS REPORT

## 2016-05-03 ENCOUNTER — Other Ambulatory Visit: Payer: Self-pay | Admitting: Internal Medicine

## 2016-08-24 ENCOUNTER — Other Ambulatory Visit: Payer: Self-pay | Admitting: Obstetrics and Gynecology

## 2016-10-22 ENCOUNTER — Ambulatory Visit (INDEPENDENT_AMBULATORY_CARE_PROVIDER_SITE_OTHER): Payer: 59 | Admitting: Obstetrics and Gynecology

## 2016-10-22 ENCOUNTER — Encounter: Payer: Self-pay | Admitting: Obstetrics and Gynecology

## 2016-10-22 VITALS — BP 123/83 | HR 121 | Ht 63.0 in | Wt 300.6 lb

## 2016-10-22 DIAGNOSIS — N946 Dysmenorrhea, unspecified: Secondary | ICD-10-CM

## 2016-10-22 DIAGNOSIS — D500 Iron deficiency anemia secondary to blood loss (chronic): Secondary | ICD-10-CM

## 2016-10-22 DIAGNOSIS — E282 Polycystic ovarian syndrome: Secondary | ICD-10-CM | POA: Diagnosis not present

## 2016-10-22 DIAGNOSIS — N921 Excessive and frequent menstruation with irregular cycle: Secondary | ICD-10-CM | POA: Diagnosis not present

## 2016-10-22 MED ORDER — NORETHIN ACE-ETH ESTRAD-FE 1.5-30 MG-MCG PO TABS: 1.0000 | ORAL_TABLET | Freq: Every day | ORAL | 1 refills | Status: DC

## 2016-10-22 NOTE — Patient Instructions (Signed)
1. Continue taking Loestrin Fe 1.5/30 oral contraceptive 2. Continue with iron supplementation 3. Maintain menstrual calendar monitoring for any abnormal uterine bleeding 4. Return in 6 months for annual exam

## 2016-10-22 NOTE — Progress Notes (Signed)
Chief complaint: 1. History of iron deficiency anemia due to chronic blood loss from menses 2. History of dysmenorrhea 3. PCO  Patient presents for 6 month follow-up. She is on Loestrin Fe 1.5/30 oral contraceptive. Her cycles have been regular on a monthly basis with duration of flow ranging from 5-7 days. She desires any significant dysmenorrhea. She denies chronic fatigue at this time. Last hemoglobin check in February 2018 was 8.8.  Past medical history, past surgical history, problem list, medications, and allergies are reviewed  OBJECTIVE: BP 123/83   Pulse (!) 121   Ht 5\' 3"  (1.6 m)   Wt (!) 300 lb 9.6 oz (136.4 kg)   LMP 10/01/2016 (Exact Date)   BMI 53.25 kg/m  Physical exam-deferred  ASSESSMENT: 1. History of anemia due to blood loss from menorrhagia, now controlled with OCPs 2. History of dysmenorrhea, resolved on OCPs 3. History of PCO  PLAN: 1. CBC 2. Continue with Loestrin Fe 1.5/30 oral contraceptive 3. Return in 6 months for follow-up and annual exam  A total of 15 minutes were spent face-to-face with the patient during this encounter and over half of that time dealt with counseling and coordination of care.  Herold HarmsMartin A Stephinie Battisti, MD  Note: This dictation was prepared with Dragon dictation along with smaller phrase technology. Any transcriptional errors that result from this process are unintentional.

## 2016-10-23 LAB — CBC
Hematocrit: 31.9 % — ABNORMAL LOW (ref 34.0–46.6)
Hemoglobin: 9.1 g/dL — ABNORMAL LOW (ref 11.1–15.9)
MCH: 17.3 pg — ABNORMAL LOW (ref 26.6–33.0)
MCHC: 28.5 g/dL — ABNORMAL LOW (ref 31.5–35.7)
MCV: 61 fL — AB (ref 79–97)
PLATELETS: 342 10*3/uL (ref 150–379)
RBC: 5.26 x10E6/uL (ref 3.77–5.28)
RDW: 18.7 % — AB (ref 12.3–15.4)
WBC: 7.5 10*3/uL (ref 3.4–10.8)

## 2016-12-27 ENCOUNTER — Other Ambulatory Visit: Payer: Self-pay

## 2016-12-27 MED ORDER — ATENOLOL 50 MG PO TABS: 75.0000 mg | ORAL_TABLET | Freq: Two times a day (BID) | ORAL | 0 refills | Status: DC

## 2016-12-27 NOTE — Telephone Encounter (Signed)
Please notify patient that the prescription  was Refilled for 30 days only.  OFFICE VISIT NEEDED prior to any more refills, OR SHE CAN REQUEST FUTURE REFILLS FROM HER CARDIOLOGIST

## 2016-12-27 NOTE — Telephone Encounter (Signed)
Refilled: 04/11/2014 Last OV in our office was with Claris CheMargaret on 12/11/2015. Last OV with Dr. Darrick Huntsmanullo was 02/06/2015. Next OV: not scheduled

## 2016-12-31 NOTE — Telephone Encounter (Signed)
LMTCB

## 2017-02-08 ENCOUNTER — Other Ambulatory Visit: Payer: Self-pay | Admitting: Obstetrics and Gynecology

## 2017-04-24 ENCOUNTER — Encounter: Payer: Self-pay | Admitting: Internal Medicine

## 2017-04-24 ENCOUNTER — Encounter: Payer: Self-pay | Admitting: Obstetrics and Gynecology

## 2017-04-24 ENCOUNTER — Ambulatory Visit (INDEPENDENT_AMBULATORY_CARE_PROVIDER_SITE_OTHER): Payer: BLUE CROSS/BLUE SHIELD | Admitting: Obstetrics and Gynecology

## 2017-04-24 VITALS — BP 123/79 | HR 97 | Ht 63.0 in | Wt 288.5 lb

## 2017-04-24 DIAGNOSIS — F418 Other specified anxiety disorders: Secondary | ICD-10-CM

## 2017-04-24 DIAGNOSIS — Z3041 Encounter for surveillance of contraceptive pills: Secondary | ICD-10-CM | POA: Diagnosis not present

## 2017-04-24 DIAGNOSIS — E282 Polycystic ovarian syndrome: Secondary | ICD-10-CM | POA: Diagnosis not present

## 2017-04-24 DIAGNOSIS — Z01419 Encounter for gynecological examination (general) (routine) without abnormal findings: Secondary | ICD-10-CM | POA: Diagnosis not present

## 2017-04-24 MED ORDER — NORETHIN ACE-ETH ESTRAD-FE 1.5-30 MG-MCG PO TABS: 1.0000 | ORAL_TABLET | Freq: Every day | ORAL | 3 refills | Status: DC

## 2017-04-24 MED ORDER — VENLAFAXINE HCL ER 75 MG PO CP24: 75.0000 mg | ORAL_CAPSULE | Freq: Every day | ORAL | 0 refills | Status: DC

## 2017-04-24 NOTE — Progress Notes (Signed)
ANNUAL PREVENTATIVE CARE GYN  ENCOUNTER NOTE  Subjective:       Julie Hammond is a 27 y.o. G0P0000 female here for a routine annual gynecologic exam.  Current complaints: 1.  Wants effexor refilled; patient is followed by Dr. Darrick Huntsmanullo, and will be seeing her within the next several months  Loestrin FE 1.5/30 is working well for cycle regulation and dysmenorrhea control. Bowel and bladder function are normal. Patient is in a stable monogamous relationship.   Gynecologic History Patient's last menstrual period was 04/21/2017 (exact date). Contraception: OCP (estrogen/progesterone) Last Pap: 2017 wnl. Results were: normal Last mammogram: n/a.   Obstetric History OB History  Gravida Para Term Preterm AB Living  0 0 0 0 0 0  SAB TAB Ectopic Multiple Live Births  0 0 0 0 0        Past Medical History:  Diagnosis Date  . Allergy   . Anxiety   . Chicken pox   . Depression   . Hypertension   . POTS (postural orthostatic tachycardia syndrome)   . Syncope and collapse     Past Surgical History:  Procedure Laterality Date  . NO PAST SURGERIES      Current Outpatient Medications on File Prior to Visit  Medication Sig Dispense Refill  . atenolol (TENORMIN) 50 MG tablet Take 1.5 tablets (75 mg total) by mouth 2 (two) times daily. 90 tablet 0  . Cholecalciferol (VITAMIN D3) 1000 UNITS CAPS Take 1 capsule by mouth daily.    . ferrous fumarate-iron polysaccharide complex (TANDEM) 162-115.2 MG CAPS capsule Take 1 capsule by mouth daily with breakfast. 30 capsule 3  . JUNEL FE 1.5/30 1.5-30 MG-MCG tablet TAKE 1 TABLET BY MOUTH  DAILY 84 tablet 1  . Multiple Vitamin (MULTIVITAMIN) tablet Take 1 tablet by mouth daily.    Marland Kitchen. venlafaxine XR (EFFEXOR-XR) 75 MG 24 hr capsule TAKE THREE CAPSULES BY MOUTH DAILY WITH BREAKFAST 90 capsule 0   No current facility-administered medications on file prior to visit.     No Known Allergies  Social History   Socioeconomic History  . Marital  status: Single    Spouse name: Not on file  . Number of children: Not on file  . Years of education: Not on file  . Highest education level: Not on file  Social Needs  . Financial resource strain: Not on file  . Food insecurity - worry: Not on file  . Food insecurity - inability: Not on file  . Transportation needs - medical: Not on file  . Transportation needs - non-medical: Not on file  Occupational History  . Not on file  Tobacco Use  . Smoking status: Never Smoker  . Smokeless tobacco: Never Used  Substance and Sexual Activity  . Alcohol use: Yes    Comment: occas  . Drug use: No  . Sexual activity: Yes    Birth control/protection: Pill, Condom  Other Topics Concern  . Not on file  Social History Narrative  . Not on file    Family History  Problem Relation Age of Onset  . Obesity Mother   . Obesity Father   . Breast cancer Neg Hx   . Ovarian cancer Neg Hx   . Colon cancer Neg Hx   . Heart disease Neg Hx     The following portions of the patient's history were reviewed and updated as appropriate: allergies, current medications, past family history, past medical history, past social history, past surgical history and problem list.  Review  of Systems Review of Systems  Constitutional: Negative.   HENT: Negative.   Eyes: Negative.   Respiratory: Negative.   Cardiovascular: Negative.   Gastrointestinal: Negative.   Genitourinary: Negative.   Musculoskeletal: Negative.   Skin: Negative.   Neurological: Negative.   Endo/Heme/Allergies: Negative.   Psychiatric/Behavioral: Negative.       Objective:   BP 123/79   Pulse 97   Ht 5\' 3"  (1.6 m)   Wt 288 lb 8 oz (130.9 kg)   LMP 04/21/2017 (Exact Date)   BMI 51.11 kg/m  CONSTITUTIONAL: Well-developed, well-nourished female in no acute distress.  PSYCHIATRIC: Normal mood and affect. Normal behavior. Normal judgment and thought content. NEUROLGIC: Alert and oriented to person, place, and time. Normal muscle  tone coordination. No cranial nerve deficit noted. HENT:  Normocephalic, atraumatic, External right and left ear normal.  EYES: Conjunctivae and EOM are normal. No scleral icterus.  NECK: Normal range of motion, supple, no masses.  Normal thyroid.  SKIN: Skin is warm and dry. No rash noted. Not diaphoretic. No erythema. No pallor. CARDIOVASCULAR: Normal heart rate noted, regular rhythm, no murmur. RESPIRATORY: Clear to auscultation bilaterally. Effort and breath sounds normal, no problems with respiration noted. BREASTS: Symmetric in size. No masses, skin changes, nipple drainage, or lymphadenopathy. ABDOMEN: Soft, normal bowel sounds, no distention noted.  No tenderness, rebound or guarding.  BLADDER: Normal PELVIC:  External Genitalia: Normal  BUS: Normal  Vagina: Normal  Cervix: Normal; no lesions; nulliparous; no cervical motion tenderness  Uterus: Normal; midplane, not enlarged, mobile, nontender  Adnexa: Normal; nonpalpable and nontender  RV: External Exam NormaI  MUSCULOSKELETAL: Normal range of motion. No tenderness.  No cyanosis, clubbing, or edema.  2+ distal pulses. LYMPHATIC: No Axillary, Supraclavicular, or Inguinal Adenopathy.    Assessment:   Annual gynecologic examination 27 y.o. Contraception: OCP (estrogen/progesterone) bmi-51 History of depression, on Effexor, stable, considering weaning off medication  Plan:  Pap: Pap, Reflex if ASCUS/ gc/ch Mammogram: Not Indicated Stool Guaiac Testing:  Not Indicated Labs: thru pcp -[facility]ullo Routine preventative health maintenance measures emphasized: Exercise/Diet/Weight control, Tobacco Warnings, Alcohol/Substance use risks, Stress Management and Safe Sex Effexor XR 75 mg daily is refilled for 3 months. Patient is to see Dr. Darrick Huntsman within the next 3 months for further monitoring and possible weaning of Effexor Return to Clinic - 1 [address] [place], [place]  Herold Harms, MD  Note: This dictation was prepared  with Dragon dictation along with smaller phrase technology. Any transcriptional errors that result from this process are unintentional.

## 2017-04-24 NOTE — Patient Instructions (Signed)
1.  Pap smear is done 2.  Self breast awareness is encouraged 3.  Loestrin Fe 1.5/30 is refilled for cycle regulation and birth control 4.  Continue with healthy eating, exercise, and control weight loss 5.  Effexor XR is refilled for 3 months.  Patient is to follow-up with Dr. Derrel Nip in 3 months for further monitoring and possible decreasing of dosage for weaning off the medication 6.  Return in 1 year for annual exam  Health Maintenance, Female Adopting a healthy lifestyle and getting preventive care can go a long way to promote health and wellness. Talk with your health care provider about what schedule of regular examinations is right for you. This is a good chance for you to check in with your provider about disease prevention and staying healthy. In between checkups, there are plenty of things you can do on your own. Experts have done a lot of research about which lifestyle changes and preventive measures are most likely to keep you healthy. Ask your health care provider for more information. Weight and diet Eat a healthy diet  Be sure to include plenty of vegetables, fruits, low-fat dairy products, and lean protein.  Do not eat a lot of foods high in solid fats, added sugars, or salt.  Get regular exercise. This is one of the most important things you can do for your health. ? Most adults should exercise for at least 150 minutes each week. The exercise should increase your heart rate and make you sweat (moderate-intensity exercise). ? Most adults should also do strengthening exercises at least twice a week. This is in addition to the moderate-intensity exercise.  Maintain a healthy weight  Body mass index (BMI) is a measurement that can be used to identify possible weight problems. It estimates body fat based on height and weight. Your health care provider can help determine your BMI and help you achieve or maintain a healthy weight.  For females 24 years of age and older: ? A BMI  below 18.5 is considered underweight. ? A BMI of 18.5 to 24.9 is normal. ? A BMI of 25 to 29.9 is considered overweight. ? A BMI of 30 and above is considered obese.  Watch levels of cholesterol and blood lipids  You should start having your blood tested for lipids and cholesterol at 26 years of age, then have this test every 5 years.  You may need to have your cholesterol levels checked more often if: ? Your lipid or cholesterol levels are high. ? You are older than 26 years of age. ? You are at high risk for heart disease.  Cancer screening Lung Cancer  Lung cancer screening is recommended for adults 20-56 years old who are at high risk for lung cancer because of a history of smoking.  A yearly low-dose CT scan of the lungs is recommended for people who: ? Currently smoke. ? Have quit within the past 15 years. ? Have at least a 30-pack-year history of smoking. A pack year is smoking an average of one pack of cigarettes a day for 1 year.  Yearly screening should continue until it has been 15 years since you quit.  Yearly screening should stop if you develop a health problem that would prevent you from having lung cancer treatment.  Breast Cancer  Practice breast self-awareness. This means understanding how your breasts normally appear and feel.  It also means doing regular breast self-exams. Let your health care provider know about any changes, no matter how  small.  If you are in your 20s or 30s, you should have a clinical breast exam (CBE) by a health care provider every 1-3 years as part of a regular health exam.  If you are 40 or older, have a CBE every year. Also consider having a breast X-ray (mammogram) every year.  If you have a family history of breast cancer, talk to your health care provider about genetic screening.  If you are at high risk for breast cancer, talk to your health care provider about having an MRI and a mammogram every year.  Breast cancer gene  (BRCA) assessment is recommended for women who have family members with BRCA-related cancers. BRCA-related cancers include: ? Breast. ? Ovarian. ? Tubal. ? Peritoneal cancers.  Results of the assessment will determine the need for genetic counseling and BRCA1 and BRCA2 testing.  Cervical Cancer Your health care provider may recommend that you be screened regularly for cancer of the pelvic organs (ovaries, uterus, and vagina). This screening involves a pelvic examination, including checking for microscopic changes to the surface of your cervix (Pap test). You may be encouraged to have this screening done every 3 years, beginning at age 17.  For women ages 6-65, health care providers may recommend pelvic exams and Pap testing every 3 years, or they may recommend the Pap and pelvic exam, combined with testing for human papilloma virus (HPV), every 5 years. Some types of HPV increase your risk of cervical cancer. Testing for HPV may also be done on women of any age with unclear Pap test results.  Other health care providers may not recommend any screening for nonpregnant women who are considered low risk for pelvic cancer and who do not have symptoms. Ask your health care provider if a screening pelvic exam is right for you.  If you have had past treatment for cervical cancer or a condition that could lead to cancer, you need Pap tests and screening for cancer for at least 20 years after your treatment. If Pap tests have been discontinued, your risk factors (such as having a new sexual partner) need to be reassessed to determine if screening should resume. Some women have medical problems that increase the chance of getting cervical cancer. In these cases, your health care provider may recommend more frequent screening and Pap tests.  Colorectal Cancer  This type of cancer can be detected and often prevented.  Routine colorectal cancer screening usually begins at 26 years of age and continues  through 26 years of age.  Your health care provider may recommend screening at an earlier age if you have risk factors for colon cancer.  Your health care provider may also recommend using home test kits to check for hidden blood in the stool.  A small camera at the end of a tube can be used to examine your colon directly (sigmoidoscopy or colonoscopy). This is done to check for the earliest forms of colorectal cancer.  Routine screening usually begins at age 61.  Direct examination of the colon should be repeated every 5-10 years through 26 years of age. However, you may need to be screened more often if early forms of precancerous polyps or small growths are found.  Skin Cancer  Check your skin from head to toe regularly.  Tell your health care provider about any new moles or changes in moles, especially if there is a change in a mole's shape or color.  Also tell your health care provider if you have a mole  that is larger than the size of a pencil eraser.  Always use sunscreen. Apply sunscreen liberally and repeatedly throughout the day.  Protect yourself by wearing long sleeves, pants, a wide-brimmed hat, and sunglasses whenever you are outside.  Heart disease, diabetes, and high blood pressure  High blood pressure causes heart disease and increases the risk of stroke. High blood pressure is more likely to develop in: ? People who have blood pressure in the high end of the normal range (130-139/85-89 mm Hg). ? People who are overweight or obese. ? People who are African American.  If you are 45-45 years of age, have your blood pressure checked every 3-5 years. If you are 18 years of age or older, have your blood pressure checked every year. You should have your blood pressure measured twice-once when you are at a hospital or clinic, and once when you are not at a hospital or clinic. Record the average of the two measurements. To check your blood pressure when you are not at a  hospital or clinic, you can use: ? An automated blood pressure machine at a pharmacy. ? A home blood pressure monitor.  If you are between 24 years and 1 years old, ask your health care provider if you should take aspirin to prevent strokes.  Have regular diabetes screenings. This involves taking a blood sample to check your fasting blood sugar level. ? If you are at a normal weight and have a low risk for diabetes, have this test once every three years after 26 years of age. ? If you are overweight and have a high risk for diabetes, consider being tested at a younger age or more often. Preventing infection Hepatitis B  If you have a higher risk for hepatitis B, you should be screened for this virus. You are considered at high risk for hepatitis B if: ? You were born in a country where hepatitis B is common. Ask your health care provider which countries are considered high risk. ? Your parents were born in a high-risk country, and you have not been immunized against hepatitis B (hepatitis B vaccine). ? You have HIV or AIDS. ? You use needles to inject street drugs. ? You live with someone who has hepatitis B. ? You have had sex with someone who has hepatitis B. ? You get hemodialysis treatment. ? You take certain medicines for conditions, including cancer, organ transplantation, and autoimmune conditions.  Hepatitis C  Blood testing is recommended for: ? Everyone born from [birth year] through 1964. ? Anyone with known risk factors for hepatitis C.  Sexually transmitted infections (STIs)  You should be screened for sexually transmitted infections (STIs) including gonorrhea and chlamydia if: ? You are sexually active and are younger than 25 years of age. ? You are older than 26 years of age and your health care provider tells you that you are at risk for this type of infection. ? Your sexual activity has changed since you were last screened and you are at an increased risk for chlamydia or  gonorrhea. Ask your health care provider if you are at risk.  If you do not have HIV, but are at risk, it may be recommended that you take a prescription medicine daily to prevent HIV infection. This is called pre-exposure prophylaxis (PrEP). You are considered at risk if: ? You are sexually active and do not regularly use condoms or know the HIV status of your partner(s). ? You take drugs by injection. ? You are  sexually active with a partner who has HIV.  Talk with your health care provider about whether you are at high risk of being infected with HIV. If you choose to begin PrEP, you should first be tested for HIV. You should then be tested every 3 months for as long as you are taking PrEP. Pregnancy  If you are premenopausal and you may become pregnant, ask your health care provider about preconception counseling.  If you may become pregnant, take 400 to 800 micrograms (mcg) of folic acid every day.  If you want to prevent pregnancy, talk to your health care provider about birth control (contraception). Osteoporosis and menopause  Osteoporosis is a disease in which the bones lose minerals and strength with aging. This can result in serious bone fractures. Your risk for osteoporosis can be identified using a bone density scan.  If you are 35 years of age or older, or if you are at risk for osteoporosis and fractures, ask your health care provider if you should be screened.  Ask your health care provider whether you should take a calcium or vitamin D supplement to lower your risk for osteoporosis.  Menopause may have certain physical symptoms and risks.  Hormone replacement therapy may reduce some of these symptoms and risks. Talk to your health care provider about whether hormone replacement therapy is right for you. Follow these instructions at home:  Schedule regular health, dental, and eye exams.  Stay current with your immunizations.  Do not use any tobacco products including  cigarettes, chewing tobacco, or electronic cigarettes.  If you are pregnant, do not drink alcohol.  If you are breastfeeding, limit how much and how often you drink alcohol.  Limit alcohol intake to no more than 1 drink per day for nonpregnant women. One drink equals 12 ounces of beer, 5 ounces of wine, or 1 ounces of hard liquor.  Do not use street drugs.  Do not share needles.  Ask your health care provider for help if you need support or information about quitting drugs.  Tell your health care provider if you often feel depressed.  Tell your health care provider if you have ever been abused or do not feel safe at home. This information is not intended to replace advice given to you by your health care provider. Make sure you discuss any questions you have with your health care provider. Document Released: 09/10/2010 Document Revised: 08/03/2015 Document Reviewed: 11/29/2014 Elsevier Interactive Patient Education  [employer].

## 2017-04-26 LAB — PAP IG, CT-NG, RFX HPV ASCU
Chlamydia, Nuc. Acid Amp: NEGATIVE
GONOCOCCUS BY NUCLEIC ACID AMP: NEGATIVE
PAP Smear Comment: 0

## 2017-06-12 ENCOUNTER — Other Ambulatory Visit: Payer: Self-pay | Admitting: Obstetrics and Gynecology

## 2017-10-06 ENCOUNTER — Ambulatory Visit (INDEPENDENT_AMBULATORY_CARE_PROVIDER_SITE_OTHER): Payer: BLUE CROSS/BLUE SHIELD

## 2017-10-06 ENCOUNTER — Ambulatory Visit: Payer: BLUE CROSS/BLUE SHIELD | Admitting: Internal Medicine

## 2017-10-06 ENCOUNTER — Encounter: Payer: Self-pay | Admitting: Internal Medicine

## 2017-10-06 VITALS — BP 104/74 | HR 94 | Temp 98.4°F | Resp 17 | Ht 63.0 in | Wt 296.8 lb

## 2017-10-06 DIAGNOSIS — M79675 Pain in left toe(s): Secondary | ICD-10-CM

## 2017-10-06 DIAGNOSIS — E559 Vitamin D deficiency, unspecified: Secondary | ICD-10-CM | POA: Diagnosis not present

## 2017-10-06 DIAGNOSIS — G90A Postural orthostatic tachycardia syndrome (POTS): Secondary | ICD-10-CM

## 2017-10-06 DIAGNOSIS — D509 Iron deficiency anemia, unspecified: Secondary | ICD-10-CM | POA: Diagnosis not present

## 2017-10-06 DIAGNOSIS — S99922A Unspecified injury of left foot, initial encounter: Secondary | ICD-10-CM | POA: Diagnosis not present

## 2017-10-06 DIAGNOSIS — I498 Other specified cardiac arrhythmias: Secondary | ICD-10-CM

## 2017-10-06 DIAGNOSIS — E282 Polycystic ovarian syndrome: Secondary | ICD-10-CM

## 2017-10-06 DIAGNOSIS — F418 Other specified anxiety disorders: Secondary | ICD-10-CM

## 2017-10-06 DIAGNOSIS — M79672 Pain in left foot: Secondary | ICD-10-CM | POA: Diagnosis not present

## 2017-10-06 DIAGNOSIS — R000 Tachycardia, unspecified: Secondary | ICD-10-CM

## 2017-10-06 DIAGNOSIS — I951 Orthostatic hypotension: Secondary | ICD-10-CM

## 2017-10-06 DIAGNOSIS — Z79899 Other long term (current) drug therapy: Secondary | ICD-10-CM

## 2017-10-06 LAB — CBC WITH DIFFERENTIAL/PLATELET
BASOS ABS: 0 10*3/uL (ref 0.0–0.1)
Basophils Relative: 0.1 % (ref 0.0–3.0)
EOS ABS: 0 10*3/uL (ref 0.0–0.7)
Eosinophils Relative: 0.4 % (ref 0.0–5.0)
HCT: 37.3 % (ref 36.0–46.0)
Hemoglobin: 11.9 g/dL — ABNORMAL LOW (ref 12.0–15.0)
LYMPHS ABS: 1.7 10*3/uL (ref 0.7–4.0)
Lymphocytes Relative: 21.8 % (ref 12.0–46.0)
MCHC: 31.8 g/dL (ref 30.0–36.0)
MCV: 68.3 fl — ABNORMAL LOW (ref 78.0–100.0)
Monocytes Absolute: 0.3 10*3/uL (ref 0.1–1.0)
Monocytes Relative: 4.2 % (ref 3.0–12.0)
NEUTROS ABS: 5.9 10*3/uL (ref 1.4–7.7)
Neutrophils Relative %: 73.5 % (ref 43.0–77.0)
PLATELETS: 300 10*3/uL (ref 150.0–400.0)
RBC: 5.46 Mil/uL — ABNORMAL HIGH (ref 3.87–5.11)
RDW: 18.1 % — ABNORMAL HIGH (ref 11.5–15.5)
WBC: 8 10*3/uL (ref 4.0–10.5)

## 2017-10-06 LAB — COMPREHENSIVE METABOLIC PANEL
ALK PHOS: 29 U/L — AB (ref 39–117)
ALT: 14 U/L (ref 0–35)
AST: 13 U/L (ref 0–37)
Albumin: 3.8 g/dL (ref 3.5–5.2)
BILIRUBIN TOTAL: 0.3 mg/dL (ref 0.2–1.2)
BUN: 10 mg/dL (ref 6–23)
CALCIUM: 9.4 mg/dL (ref 8.4–10.5)
CO2: 25 meq/L (ref 19–32)
Chloride: 104 mEq/L (ref 96–112)
Creatinine, Ser: 0.73 mg/dL (ref 0.40–1.20)
GFR: 102.41 mL/min (ref >60.00)
Glucose, Bld: 87 mg/dL (ref 70–99)
POTASSIUM: 4.2 meq/L (ref 3.5–5.1)
Sodium: 137 mEq/L (ref 135–145)
TOTAL PROTEIN: 7.6 g/dL (ref 6.0–8.3)

## 2017-10-06 LAB — IRON,TIBC AND FERRITIN PANEL
%SAT: 5 % — AB (ref 16–45)
Ferritin: 6 ng/mL — ABNORMAL LOW (ref 16–154)
Iron: 30 ug/dL — ABNORMAL LOW (ref 40–190)
TIBC: 546 ug/dL — AB (ref 250–450)

## 2017-10-06 LAB — VITAMIN D 25 HYDROXY (VIT D DEFICIENCY, FRACTURES): VITD: 12.8 ng/mL — ABNORMAL LOW (ref 30.00–100.00)

## 2017-10-06 MED ORDER — VENLAFAXINE HCL ER 37.5 MG PO CP24: 37.5000 mg | ORAL_CAPSULE | Freq: Every day | ORAL | 0 refills | Status: DC

## 2017-10-06 MED ORDER — ATENOLOL 50 MG PO TABS: 50.0000 mg | ORAL_TABLET | Freq: Two times a day (BID) | ORAL | 0 refills | Status: DC

## 2017-10-06 NOTE — Progress Notes (Signed)
Subjective:  Patient ID: Julie Hammond, female    DOB: Jun 28, 1990  Age: 27 y.o. MRN: [id number]  CC: The primary encounter diagnosis was Pain of toe of left foot. Diagnoses of Vitamin D deficiency, Iron deficiency anemia, unspecified iron deficiency anemia type, Long-term use of high-risk medication, Toe pain, left, POTS (postural orthostatic tachycardia syndrome), Anxiety with obsessional features, and Polycystic ovarian syndrome were also pertinent to this visit.  HPI Julie Hammond presents for toe pain and follow up and chronic conditions.  Patient has been lost to follow up due to loss of insurance and was last seen in 2016.  Sees [facility] for well woman exam.   Last CPE  Feb 2019  PAP normal .  Using OCPs for regulation of periods.   Cc:  Toe pain.  She stubbed left 3rd toe on a door yesterday and has developed bruising, pain and swelling.  The pain is aggravated by weight bearing .  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  She has not tried icing or used analgesics.   2)  History of POTS:  Has not taken atenolol in over a year due to loss of insurance  3) GAD has not taken Effexor in over a year due  to being lost to follow up.  She reports increased anxiety since starting her new job due to the demands of her customers for quality work. She denies panic attacks,  But feels irritable and on edge a lot.. Occasional insomnia, but better since moving out of parent's home and living with a female roommate.   Outpatient Medications Prior to Visit  Medication Sig Dispense Refill  . Cholecalciferol (VITAMIN D3) 1000 UNITS CAPS Take 1 capsule by mouth daily.    . Multiple Vitamin (MULTIVITAMIN) tablet Take 1 tablet by mouth daily.    . norethindrone-ethinyl estradiol-iron (JUNEL FE 1.5/30) 1.5-30 MG-MCG tablet Take 1 tablet by mouth daily. 84 tablet 3  . ferrous fumarate-iron polysaccharide complex (TANDEM) 162-115.2 MG CAPS capsule Take 1 capsule by mouth  daily with breakfast. 30 capsule 3  . atenolol (TENORMIN) 50 MG tablet Take 1.5 tablets (75 mg total) by mouth 2 (two) times daily. (Patient not taking: Reported on 10/06/2017) 90 tablet 0  . venlafaxine XR (EFFEXOR-XR) 75 MG 24 hr capsule TAKE 1 CAPSULE BY MOUTH  DAILY WITH BREAKFAST (Patient not taking: Reported on 10/06/2017) 90 capsule 0   No facility-administered medications prior to visit.     Review of Systems;  Patient denies headache, fevers, malaise, unintentional weight loss, skin rash, eye pain, sinus congestion and sinus pain, sore throat, dysphagia,  hemoptysis , cough, dyspnea, wheezing, chest pain, palpitations, orthopnea, edema, abdominal pain, nausea, melena, diarrhea, constipation, flank pain, dysuria, hematuria, urinary  Frequency, nocturia, numbness, tingling, seizures,  Focal weakness, Loss of consciousness,  Tremor,  depression,  and suicidal ideation.      Objective:  BP 104/74 (BP Location: Left Arm, Patient Position: Sitting, Cuff Size: Large)   Pulse 94   Temp 98.4 F (36.9 C) (Oral)   Resp 17   Ht 5\' 3"  (1.6 m)   Wt 296 lb 12.8 oz (134.6 kg)   SpO2 99%   BMI 52.58 kg/m   BP Readings from Last 3 Encounters:  10/06/17 104/74  04/24/17 123/79  10/22/16 123/83    Wt Readings from Last 3 Encounters:  10/06/17 296 lb 12.8 oz (134.6 kg)  04/24/17 288 lb 8 oz (130.9 kg)  10/22/16 (!) 300 lb 9.6  oz (136.4 kg)    General appearance: alert, cooperative and appears stated age Ears: normal TM's and external ear canals both ears Throat: lips, mucosa, and tongue normal; teeth and gums normal Neck: no adenopathy, no carotid bruit, supple, symmetrical, trachea midline and thyroid not enlarged, symmetric, no tenderness/mass/nodules Back: symmetric, no curvature. ROM normal. No CVA tenderness. Lungs: clear to auscultation bilaterally Heart: regular rate and rhythm, S1, S2 normal, no murmur, click, rub or gallop Abdomen: soft, non-tender; bowel sounds normal; no  masses,  no organomegaly Pulses: 2+ and symmetric Skin: Skin color, texture, turgor normal. No rashes or lesions MSK:  3rd toe left foot with unforme swelling,  Ecchymosis. Pain  With squeezing of MT heads .   Lymph nodes: Cervical, supraclavicular, and axillary nodes normal.  Lab Results  Component Value Date   HGBA1C 5.2 12/11/2015   HGBA1C 4.7 10/21/2013    Lab Results  Component Value Date   CREATININE 0.73 10/06/2017   CREATININE 0.60 12/11/2015   CREATININE 0.62 08/26/2014    Lab Results  Component Value Date   WBC 8.0 10/06/2017   HGB 11.9 (L) 10/06/2017   HCT 37.3 10/06/2017   PLT 300.0 10/06/2017   GLUCOSE 87 10/06/2017   CHOL 144 11/28/2011   TRIG 53.0 11/28/2011   HDL 43.50 11/28/2011   LDLCALC 90 11/28/2011   ALT 14 10/06/2017   AST 13 10/06/2017   NA 137 10/06/2017   K 4.2 10/06/2017   CL 104 10/06/2017   CREATININE 0.73 10/06/2017   BUN 10 10/06/2017   CO2 25 10/06/2017   TSH 1.05 12/11/2015   HGBA1C 5.2 12/11/2015    No results found.  Assessment & Plan:   Problem List Items Addressed This Visit    Vitamin D deficiency    Extremely low again .  Will resume Drisdol weekly       Relevant Orders   VITAMIN D 25 Hydroxy (Vit-D Deficiency, Fractures) (Completed)   Toe pain, left    Fracture of MT or digital phalanx suspected,  But plain films of both were negative for fracture.  Ice, elevation,  NSAIDs, ,  Cleared to return to work.       POTS (postural orthostatic tachycardia syndrome)    Resuming atenolol startign at 50 mg bid,  Previous dose was titrated to 75 mg bid.       Relevant Medications   atenolol (TENORMIN) 50 MG tablet   Polycystic ovarian syndrome    Continue OCPS.  liver enzymes have been checked and are normal. .  She sees gyn for PAP smears .   Lab Results  Component Value Date   ALT 14 10/06/2017   AST 13 10/06/2017   ALKPHOS 29 (L) 10/06/2017   BILITOT 0.3 10/06/2017         Iron deficiency anemia    Secondary to  menorrhagia due to PCOS.  her anemia has resolved but she remains iron deficient.  Will advise her to resume daily iron supplementation      Relevant Medications   ferrous fumarate-iron polysaccharide complex (TANDEM) 162-115.2 MG CAPS capsule   Other Relevant Orders   Iron, TIBC and Ferritin Panel (Completed)   CBC with Differential/Platelet (Completed)   Anxiety with obsessional features    Resuming generic Effexor starting at 37.5 mg daily.  Previous dose was 75 mg before it was discontinued,  So she has been advised that she may increase the dose after 1-2 weeks if needed to 75 mg  Relevant Medications   venlafaxine XR (EFFEXOR-XR) 37.5 MG 24 hr capsule    Other Visit Diagnoses    Pain of toe of left foot    -  Primary   Relevant Orders   DG Toe 3rd Left (Completed)   DG Foot Complete Left (Completed)   Long-term use of high-risk medication       Relevant Orders   Comprehensive metabolic panel (Completed)      I have discontinued Charee Rasp's venlafaxine XR. I have also changed her atenolol. Additionally, I am having her start on venlafaxine XR and ergocalciferol. Lastly, I am having her maintain her multivitamin, Vitamin D3, norethindrone-ethinyl estradiol-iron, and ferrous fumarate-iron polysaccharide complex.  Meds ordered this encounter  Medications  . venlafaxine XR (EFFEXOR-XR) 37.5 MG 24 hr capsule    Sig: Take 1 capsule (37.5 mg total) by mouth daily with breakfast.    Dispense:  90 capsule    Refill:  0  . atenolol (TENORMIN) 50 MG tablet    Sig: Take 1 tablet (50 mg total) by mouth 2 (two) times daily.    Dispense:  180 tablet    Refill:  0  . ferrous fumarate-iron polysaccharide complex (TANDEM) 162-115.2 MG CAPS capsule    Sig: Take 1 capsule by mouth daily with breakfast.    Dispense:  30 capsule    Refill:  3  . ergocalciferol (DRISDOL) 50000 units capsule    Sig: Take 1 capsule (50,000 Units total) by mouth once a week.    Dispense:  12  capsule    Refill:  3    Medications Discontinued During This Encounter  Medication Reason  . venlafaxine XR (EFFEXOR-XR) 75 MG 24 hr capsule   . atenolol (TENORMIN) 50 MG tablet Reorder  . ferrous fumarate-iron polysaccharide complex (TANDEM) 162-115.2 MG CAPS capsule Reorder    Follow-up: Return in about 6 months (around 04/08/2018).   Sherlene Shamseresa L Kenlea Woodell, MD

## 2017-10-06 NOTE — Patient Instructions (Addendum)
You may have fractured the 3rd toe or the metatarsal .    You can use ibuprofen 800 mg every 8 hours and add tylenol; 650 mg every 8 hours  Ice it and elevate  Buddy tape the 3rd to to the 2nd  Or the 4th toe and hard bottom soled shoes    Length of Out of Work note depends on degree of fracture   I have refilled the Effexor and the atenolol at the lower doses to get you started back on them  You can increase the effexoroto 75 mg once daily after a week of the 37.5 mg dose if you tolerate it   Your previous dose of atenolol was 75 mg (1.5 tablets)  Every 12 hour,s  But I am starting you at 50 mg  Once or twice daily as tolerated

## 2017-10-07 DIAGNOSIS — D509 Iron deficiency anemia, unspecified: Secondary | ICD-10-CM | POA: Insufficient documentation

## 2017-10-07 DIAGNOSIS — M79675 Pain in left toe(s): Secondary | ICD-10-CM | POA: Insufficient documentation

## 2017-10-07 MED ORDER — ERGOCALCIFEROL 1.25 MG (50000 UT) PO CAPS: 50000.0000 [IU] | ORAL_CAPSULE | ORAL | 3 refills | Status: DC

## 2017-10-07 MED ORDER — FERROUS FUM-IRON POLYSACCH 162-115.2 MG PO CAPS: 1.0000 | ORAL_CAPSULE | Freq: Every day | ORAL | 3 refills | Status: DC

## 2017-10-07 NOTE — Assessment & Plan Note (Signed)
Continue OCPS.  liver enzymes have been checked and are normal. .  She sees gyn for PAP smears .   Lab Results  Component Value Date   ALT 14 10/06/2017   AST 13 10/06/2017   ALKPHOS 29 (L) 10/06/2017   BILITOT 0.3 10/06/2017

## 2017-10-07 NOTE — Assessment & Plan Note (Signed)
Resuming generic Effexor starting at 37.5 mg daily.  Previous dose was 75 mg before it was discontinued,  So she has been advised that she may increase the dose after 1-2 weeks if needed to 75 mg

## 2017-10-07 NOTE — Assessment & Plan Note (Signed)
Extremely low again .  Will resume Drisdol weekly

## 2017-10-07 NOTE — Assessment & Plan Note (Signed)
Fracture of MT or digital phalanx suspected,  But plain films of both were negative for fracture.  Ice, elevation,  NSAIDs, ,  Cleared to return to work.

## 2017-10-07 NOTE — Assessment & Plan Note (Addendum)
Secondary to menorrhagia due to PCOS.  her anemia has resolved but she remains iron deficient.  Will advise her to resume daily iron supplementation 

## 2017-10-07 NOTE — Assessment & Plan Note (Signed)
Resuming atenolol startign at 50 mg bid,  Previous dose was titrated to 75 mg bid.

## 2017-12-23 ENCOUNTER — Other Ambulatory Visit: Payer: Self-pay | Admitting: Internal Medicine

## 2018-04-08 ENCOUNTER — Encounter: Payer: Self-pay | Admitting: Internal Medicine

## 2018-04-08 ENCOUNTER — Ambulatory Visit: Payer: BLUE CROSS/BLUE SHIELD | Admitting: Internal Medicine

## 2018-04-08 ENCOUNTER — Ambulatory Visit (INDEPENDENT_AMBULATORY_CARE_PROVIDER_SITE_OTHER): Payer: BLUE CROSS/BLUE SHIELD

## 2018-04-08 VITALS — BP 120/78 | HR 93 | Temp 98.6°F | Resp 16 | Ht 63.0 in | Wt 299.6 lb

## 2018-04-08 DIAGNOSIS — D509 Iron deficiency anemia, unspecified: Secondary | ICD-10-CM

## 2018-04-08 DIAGNOSIS — G8929 Other chronic pain: Secondary | ICD-10-CM

## 2018-04-08 DIAGNOSIS — E282 Polycystic ovarian syndrome: Secondary | ICD-10-CM

## 2018-04-08 DIAGNOSIS — F418 Other specified anxiety disorders: Secondary | ICD-10-CM

## 2018-04-08 DIAGNOSIS — Z23 Encounter for immunization: Secondary | ICD-10-CM | POA: Diagnosis not present

## 2018-04-08 DIAGNOSIS — E559 Vitamin D deficiency, unspecified: Secondary | ICD-10-CM

## 2018-04-08 DIAGNOSIS — M545 Low back pain, unspecified: Secondary | ICD-10-CM

## 2018-04-08 DIAGNOSIS — M25551 Pain in right hip: Secondary | ICD-10-CM | POA: Diagnosis not present

## 2018-04-08 LAB — COMPREHENSIVE METABOLIC PANEL
ALBUMIN: 3.8 g/dL (ref 3.5–5.2)
ALK PHOS: 37 U/L — AB (ref 39–117)
ALT: 36 U/L — AB (ref 0–35)
AST: 22 U/L (ref 0–37)
BILIRUBIN TOTAL: 0.2 mg/dL (ref 0.2–1.2)
BUN: 12 mg/dL (ref 6–23)
CALCIUM: 9.3 mg/dL (ref 8.4–10.5)
CO2: 23 mEq/L (ref 19–32)
Chloride: 106 mEq/L (ref 96–112)
Creatinine, Ser: 0.69 mg/dL (ref 0.40–1.20)
GFR: 102.43 mL/min (ref >60.00)
Glucose, Bld: 84 mg/dL (ref 70–99)
Potassium: 4.1 mEq/L (ref 3.5–5.1)
Sodium: 137 mEq/L (ref 135–145)
TOTAL PROTEIN: 7.2 g/dL (ref 6.0–8.3)

## 2018-04-08 LAB — CBC WITH DIFFERENTIAL/PLATELET
Basophils Absolute: 0 10*3/uL (ref 0.0–0.1)
Basophils Relative: 0.3 % (ref 0.0–3.0)
EOS PCT: 0.7 % (ref 0.0–5.0)
Eosinophils Absolute: 0.1 10*3/uL (ref 0.0–0.7)
HCT: 38.4 % (ref 36.0–46.0)
HEMOGLOBIN: 12.2 g/dL (ref 12.0–15.0)
LYMPHS ABS: 1.8 10*3/uL (ref 0.7–4.0)
Lymphocytes Relative: 21.2 % (ref 12.0–46.0)
MCHC: 31.7 g/dL (ref 30.0–36.0)
MCV: 70.1 fl — ABNORMAL LOW (ref 78.0–100.0)
MONOS PCT: 5.1 % (ref 3.0–12.0)
Monocytes Absolute: 0.4 10*3/uL (ref 0.1–1.0)
NEUTROS PCT: 72.7 % (ref 43.0–77.0)
Neutro Abs: 6 10*3/uL (ref 1.4–7.7)
Platelets: 269 10*3/uL (ref 150.0–400.0)
RBC: 5.47 Mil/uL — AB (ref 3.87–5.11)
RDW: 17.1 % — ABNORMAL HIGH (ref 11.5–15.5)
WBC: 8.3 10*3/uL (ref 4.0–10.5)

## 2018-04-08 LAB — POCT URINE PREGNANCY: Preg Test, Ur: NEGATIVE

## 2018-04-08 LAB — HEMOGLOBIN A1C: Hgb A1c MFr Bld: 5.3 % (ref 4.6–6.5)

## 2018-04-08 LAB — VITAMIN D 25 HYDROXY (VIT D DEFICIENCY, FRACTURES): VITD: 15.71 ng/mL — ABNORMAL LOW (ref 30.00–100.00)

## 2018-04-08 MED ORDER — VENLAFAXINE HCL ER 75 MG PO CP24: ORAL_CAPSULE | ORAL | 1 refills | Status: DC

## 2018-04-08 NOTE — Patient Instructions (Signed)
Increase Effexor dose to 75 mg daily  Here are the names of several well respected female therapists   Montel Clock    [phone]  [place] [address] [place]   [phone] [place] [phone]  Anson Crofts  [phone]  [place]    I encourage you to find a form of exercise you can tolerate that will kee you moving  For 30 minutes 5 days per week  The INtermittent Fasting Diet involves fasting for 16 hours  Daily and eating 2 sensible meals during the 8 hour fast    To make a low carb chip :  Take the Joseph's Lavash or Pita bread,  Or the [place] Low carb whole wheat tortilla   Place on metal cookie sheet  Brush with olive oil  Sprinkle garlic powder (NOT garlic salt), grated parmesan cheese, mediterranean seasoning , or all of them?  Bake at 275 for 30 minutes   We have substitutions for your potatoes!!  Try the mashed cauliflower and riced cauliflower dishes instead of rice and mashed potatoes  Mashed turnips are also very low carb!   For desserts :  Try the Dannon Lt n Fit greek yogurt dessert flavors and top with reddi Whip .  8 carbs,  80 calories  Try Oikos Triple Zero [place] Yogurt in the salted caramel, and the coffee flavors  With Whipped Cream for dessert  breyer's low carb ice cream, available in bars (on a stick, better ) or scoopable ice cream  HERE ARE THE LOW CARB  BREAD CHOICES

## 2018-04-08 NOTE — Progress Notes (Signed)
Subjective:  Patient ID: Julie FowlerMikayla Hammond, female    DOB: 1990/04/02  Age: 28 y.o. MRN: [id number]  CC: The primary encounter diagnosis was Iron deficiency anemia, unspecified iron deficiency anemia type. Diagnoses of Vitamin D deficiency, Morbid obesity (HCC), Posterior pain of hip, right, Need for immunization against influenza, Anxiety with obsessional features, Chronic left-sided low back pain without sciatica, and Polycystic ovarian syndrome were also pertinent to this visit.  HPI Julie Hammond presents for follow up  On anxiety with obsessivn features,  Managed with Effexor, obesity with  PCOS,  IDA, and   POTS  Last seen July 2019   Annual  preventive exam by [facility] .  No history of abnormal PAPs.  Last one  Feb 2019    Right side back pain radiates occasionally   To the knee but always to the hip, aggravated by cold and by walking .  Thinks it may be sciatica  Walks at most 2 days per week,  On weekends  Has cut out most sodas and refined sugars   Has been more irritable lately with roommates  Discussed increasing dose to 75 mg  And referral to female therapist    Ran out of iron recently.   Outpatient Medications Prior to Visit  Medication Sig Dispense Refill  . atenolol (TENORMIN) 50 MG tablet TAKE 1 TABLET BY MOUTH TWICE A DAY 180 tablet 1  . Multiple Vitamin (MULTIVITAMIN) tablet Take 1 tablet by mouth daily.    . norethindrone-ethinyl estradiol-iron (JUNEL FE 1.5/30) 1.5-30 MG-MCG tablet Take 1 tablet by mouth daily. 84 tablet 3  . ergocalciferol (DRISDOL) 50000 units capsule Take 1 capsule (50,000 Units total) by mouth once a week. 12 capsule 3  . venlafaxine XR (EFFEXOR-XR) 37.5 MG 24 hr capsule TAKE 1 CAPSULE BY MOUTH EVERY DAY WITH BREAKFAST 90 capsule 1  . Cholecalciferol (VITAMIN D3) 1000 UNITS CAPS Take 1 capsule by mouth daily.    . ferrous fumarate-iron polysaccharide complex (TANDEM) 162-115.2 MG CAPS capsule Take 1 capsule by mouth daily with  breakfast. (Patient not taking: Reported on 04/08/2018) 30 capsule 3   No facility-administered medications prior to visit.     Review of Systems;  Patient denies headache, fevers, malaise, unintentional weight loss, skin rash, eye pain, sinus congestion and sinus pain, sore throat, dysphagia,  hemoptysis , cough, dyspnea, wheezing, chest pain, palpitations, orthopnea, edema, abdominal pain, nausea, melena, diarrhea, constipation, flank pain, dysuria, hematuria, urinary  Frequency, nocturia, numbness, tingling, seizures,  Focal weakness, Loss of consciousness,  Tremor, insomnia, depression, anxiety, and suicidal ideation.      Objective:  BP 120/78 (BP Location: Left Arm, Patient Position: Sitting, Cuff Size: Large)   Pulse 93   Temp 98.6 F (37 C) (Oral)   Resp 16   Ht 5\' 3"  (1.6 m)   Wt 299 lb 9.6 oz (135.9 kg)   SpO2 98%   BMI 53.07 kg/m   BP Readings from Last 3 Encounters:  04/08/18 120/78  10/06/17 104/74  04/24/17 123/79    Wt Readings from Last 3 Encounters:  04/08/18 299 lb 9.6 oz (135.9 kg)  10/06/17 296 lb 12.8 oz (134.6 kg)  04/24/17 288 lb 8 oz (130.9 kg)    General appearance: alert, cooperative and appears stated age Ears: normal TM's and external ear canals both ears Throat: lips, mucosa, and tongue normal; teeth and gums normal Neck: no adenopathy, no carotid bruit, supple, symmetrical, trachea midline and thyroid not enlarged, symmetric, no tenderness/mass/nodules Back: symmetric, no curvature.  ROM normal. No CVA tenderness. Lungs: clear to auscultation bilaterally Heart: regular rate and rhythm, S1, S2 normal, no murmur, click, rub or gallop Abdomen: soft, non-tender; bowel sounds normal; no masses,  no organomegaly Pulses: 2+ and symmetric Skin: Skin color, texture, turgor normal. No rashes or lesions Lymph nodes: Cervical, supraclavicular, and axillary nodes normal.  Lab Results  Component Value Date   HGBA1C 5.3 04/08/2018   HGBA1C 5.2 12/11/2015    HGBA1C 4.7 10/21/2013    Lab Results  Component Value Date   CREATININE 0.69 04/08/2018   CREATININE 0.73 10/06/2017   CREATININE 0.60 12/11/2015    Lab Results  Component Value Date   WBC 8.3 04/08/2018   HGB 12.2 04/08/2018   HCT 38.4 04/08/2018   PLT 269.0 04/08/2018   GLUCOSE 84 04/08/2018   CHOL 144 11/28/2011   TRIG 53.0 11/28/2011   HDL 43.50 11/28/2011   LDLCALC 90 11/28/2011   ALT 36 (H) 04/08/2018   AST 22 04/08/2018   NA 137 04/08/2018   K 4.1 04/08/2018   CL 106 04/08/2018   CREATININE 0.69 04/08/2018   BUN 12 04/08/2018   CO2 23 04/08/2018   TSH 1.05 12/11/2015   HGBA1C 5.3 04/08/2018    No results found.  Assessment & Plan:   Problem List Items Addressed This Visit    Anxiety with obsessional features    She has had increased irritability lately and is lashing out at her roommates  She is reticent to discuss the source of her anxiety but is interested in talking with a psychologist .  Increasing dose of Effexor to 75 mg recommended, and she was givne the names of several female therapists in the area.       Relevant Medications   venlafaxine XR (EFFEXOR-XR) 75 MG 24 hr capsule   Back pain    Plain films today suggestive mild disk herniation at L4 and L5 levels.  PT recommended for core strengthening exercises and lifting precautions given.       Iron deficiency anemia - Primary    Secondary to menorrhagia due to PCOS.  her anemia has resolved but she remains iron deficient.  Will advise her to resume daily iron supplementation      Relevant Medications   ferrous fumarate-iron polysaccharide complex (TANDEM) 162-115.2 MG CAPS capsule   Other Relevant Orders   CBC with Differential/Platelet (Completed)   Iron, TIBC and Ferritin Panel (Completed)   Polycystic ovarian syndrome    No signs of metabolic syndrome thus far. She needs to have fasting lipids repeated   Lab Results  Component Value Date   HGBA1C 5.3 04/08/2018   Lab Results    Component Value Date   CHOL 144 11/28/2011   HDL 43.50 11/28/2011   LDLCALC 90 11/28/2011   TRIG 53.0 11/28/2011   CHOLHDL 3 11/28/2011         Relevant Orders   Lipid panel   Vitamin D deficiency    Recurrent.  Resume weekly megadose in perpetuity       Relevant Orders   VITAMIN D 25 Hydroxy (Vit-D Deficiency, Fractures) (Completed)    Other Visit Diagnoses    Morbid obesity (HCC)       Relevant Orders   Hemoglobin A1c (Completed)   Comprehensive metabolic panel (Completed)   Posterior pain of hip, right       Relevant Orders   DG Lumbar Spine Complete (Completed)   POCT urine pregnancy (Completed)   Need for immunization against influenza  Relevant Orders   Flu Vaccine QUAD 36+ mos IM (Completed)     A total of 25 minutes of face to face time was spent with patient more than half of which was spent in counselling about the above mentioned conditions  and coordination of care  I have discontinued Mollyann Blackshire's Vitamin D3. I have also changed her venlafaxine XR, ergocalciferol, and ferrous fumarate-iron polysaccharide complex. Additionally, I am having her maintain her multivitamin, norethindrone-ethinyl estradiol-iron, and atenolol.  Meds ordered this encounter  Medications  . venlafaxine XR (EFFEXOR-XR) 75 MG 24 hr capsule    Sig: TAKE 1 CAPSULE BY MOUTH EVERY DAY WITH BREAKFAST    Dispense:  90 capsule    Refill:  1    NOTE DOSE CHANGE  . ergocalciferol (DRISDOL) 1.25 MG (50000 UT) capsule    Sig: Take 1 capsule (50,000 Units total) by mouth once a week.    Dispense:  12 capsule    Refill:  3  . ferrous fumarate-iron polysaccharide complex (TANDEM) 162-115.2 MG CAPS capsule    Sig: Take 1 capsule by mouth daily with breakfast. And orange juice    Dispense:  90 capsule    Refill:  1    Medications Discontinued During This Encounter  Medication Reason  . Cholecalciferol (VITAMIN D3) 1000 UNITS CAPS Patient has not taken in last 30 days  .  venlafaxine XR (EFFEXOR-XR) 37.5 MG 24 hr capsule   . ergocalciferol (DRISDOL) 50000 units capsule Reorder  . ferrous fumarate-iron polysaccharide complex (TANDEM) 162-115.2 MG CAPS capsule Reorder    Follow-up: No follow-ups on file.   Sherlene Shams, MD

## 2018-04-09 DIAGNOSIS — M549 Dorsalgia, unspecified: Secondary | ICD-10-CM | POA: Insufficient documentation

## 2018-04-09 LAB — IRON,TIBC AND FERRITIN PANEL
%SAT: 5 % — AB (ref 16–45)
Ferritin: 10 ng/mL — ABNORMAL LOW (ref 16–154)
IRON: 22 ug/dL — AB (ref 40–190)
TIBC: 475 ug/dL — AB (ref 250–450)

## 2018-04-09 MED ORDER — FERROUS FUM-IRON POLYSACCH 162-115.2 MG PO CAPS: 1.0000 | ORAL_CAPSULE | Freq: Every day | ORAL | 1 refills | Status: DC

## 2018-04-09 MED ORDER — ERGOCALCIFEROL 1.25 MG (50000 UT) PO CAPS: 50000.0000 [IU] | ORAL_CAPSULE | ORAL | 3 refills | Status: DC

## 2018-04-09 NOTE — Assessment & Plan Note (Signed)
Recurrent.  Resume weekly megadose in perpetuity

## 2018-04-09 NOTE — Assessment & Plan Note (Signed)
Plain films today suggestive mild disk herniation at L4 and L5 levels.  PT recommended for core strengthening exercises and lifting precautions given.

## 2018-04-09 NOTE — Assessment & Plan Note (Signed)
No signs of metabolic syndrome thus far. She needs to have fasting lipids repeated   Lab Results  Component Value Date   HGBA1C 5.3 04/08/2018   Lab Results  Component Value Date   CHOL 144 11/28/2011   HDL 43.50 11/28/2011   LDLCALC 90 11/28/2011   TRIG 53.0 11/28/2011   CHOLHDL 3 11/28/2011

## 2018-04-09 NOTE — Assessment & Plan Note (Signed)
Secondary to menorrhagia due to PCOS.  her anemia has resolved but she remains iron deficient.  Will advise her to resume daily iron supplementation

## 2018-04-09 NOTE — Assessment & Plan Note (Signed)
She has had increased irritability lately and is lashing out at her roommates  She is reticent to discuss the source of her anxiety but is interested in talking with a psychologist .  Increasing dose of Effexor to 75 mg recommended, and she was givne the names of several female therapists in the area.

## 2018-04-13 MED ORDER — NORETHIN ACE-ETH ESTRAD-FE 1.5-30 MG-MCG PO TABS: 1.0000 | ORAL_TABLET | Freq: Every day | ORAL | 3 refills | Status: DC

## 2018-04-28 ENCOUNTER — Encounter: Payer: BLUE CROSS/BLUE SHIELD | Admitting: Obstetrics and Gynecology

## 2018-04-30 ENCOUNTER — Other Ambulatory Visit: Payer: Self-pay | Admitting: Obstetrics and Gynecology

## 2018-06-21 ENCOUNTER — Other Ambulatory Visit: Payer: Self-pay | Admitting: Internal Medicine

## 2018-09-21 ENCOUNTER — Other Ambulatory Visit: Payer: Self-pay | Admitting: Internal Medicine

## 2018-10-07 ENCOUNTER — Encounter: Payer: Self-pay | Admitting: Internal Medicine

## 2018-10-07 ENCOUNTER — Ambulatory Visit (INDEPENDENT_AMBULATORY_CARE_PROVIDER_SITE_OTHER): Payer: Self-pay | Admitting: Internal Medicine

## 2018-10-07 ENCOUNTER — Other Ambulatory Visit: Payer: Self-pay

## 2018-10-07 DIAGNOSIS — Z8249 Family history of ischemic heart disease and other diseases of the circulatory system: Secondary | ICD-10-CM

## 2018-10-07 DIAGNOSIS — R000 Tachycardia, unspecified: Secondary | ICD-10-CM

## 2018-10-07 DIAGNOSIS — G90A Postural orthostatic tachycardia syndrome (POTS): Secondary | ICD-10-CM

## 2018-10-07 DIAGNOSIS — F339 Major depressive disorder, recurrent, unspecified: Secondary | ICD-10-CM

## 2018-10-07 DIAGNOSIS — D509 Iron deficiency anemia, unspecified: Secondary | ICD-10-CM

## 2018-10-07 DIAGNOSIS — I951 Orthostatic hypotension: Secondary | ICD-10-CM

## 2018-10-07 DIAGNOSIS — I498 Other specified cardiac arrhythmias: Secondary | ICD-10-CM

## 2018-10-07 MED ORDER — BUPROPION HCL ER (XL) 150 MG PO TB24: 150.0000 mg | ORAL_TABLET | Freq: Every day | ORAL | 1 refills | Status: DC
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
## 2018-10-07 NOTE — Assessment & Plan Note (Signed)
Per patient,  Advised to follow up with Dr Caryl Comes

## 2018-10-07 NOTE — Assessment & Plan Note (Addendum)
She continues to have episdoes of exertional tachycardia.  Encouraged to improve conditioning with regular participation in exercise and advised to follow up with Dr Caryl Comes
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
## 2018-10-07 NOTE — Assessment & Plan Note (Signed)
Secondary to menorrhagia due to PCOS.  her anemia has resolved but she has remained  iron deficient.  She was advised  to resume daily iron supplementation and will repeat labs today.  Lab Results  Component Value Date   IRON 22 (L) 04/08/2018   TIBC 475 (H) 04/08/2018   FERRITIN 10 (L) 04/08/2018

## 2018-10-12 ENCOUNTER — Telehealth: Payer: Self-pay

## 2018-10-12 NOTE — Telephone Encounter (Signed)
Does a new referral need to be placed by Dr. Derrel Nip.

## 2018-10-12 NOTE — Telephone Encounter (Signed)
Copied from [place] [id number]. Topic: General - Other >> Oct 12, 2018  1:53 PM Wynetta Emery, [place] C wrote: Reason for CRM: Anderson Malta with [facility], called in to request to have the referral sent back to them. Pt has a new insurance and is now in their network. Effective 05/10/2018 - [employer]    If needed.  CB: [phone] Fax: [phone]

## 2018-10-13 NOTE — Telephone Encounter (Signed)
Thank you :)

## 2018-10-13 NOTE — Telephone Encounter (Signed)
Nope, I have resent it back to Dr. Nicolasa Ducking. Melissa

## 2018-10-15 ENCOUNTER — Other Ambulatory Visit: Payer: Self-pay | Admitting: Internal Medicine

## 2018-11-03 ENCOUNTER — Other Ambulatory Visit: Payer: Self-pay | Admitting: Internal Medicine

## 2018-12-16 ENCOUNTER — Other Ambulatory Visit: Payer: Self-pay | Admitting: Internal Medicine

## 2018-12-29 DIAGNOSIS — F4011 Social phobia, generalized: Secondary | ICD-10-CM | POA: Diagnosis not present

## 2018-12-29 DIAGNOSIS — F5105 Insomnia due to other mental disorder: Secondary | ICD-10-CM | POA: Diagnosis not present

## 2018-12-29 DIAGNOSIS — F331 Major depressive disorder, recurrent, moderate: Secondary | ICD-10-CM | POA: Diagnosis not present

## 2019-01-21 DIAGNOSIS — F331 Major depressive disorder, recurrent, moderate: Secondary | ICD-10-CM | POA: Diagnosis not present

## 2019-01-21 DIAGNOSIS — F5105 Insomnia due to other mental disorder: Secondary | ICD-10-CM | POA: Diagnosis not present

## 2019-01-21 DIAGNOSIS — F4011 Social phobia, generalized: Secondary | ICD-10-CM | POA: Diagnosis not present

## 2019-01-29 DIAGNOSIS — F331 Major depressive disorder, recurrent, moderate: Secondary | ICD-10-CM | POA: Diagnosis not present

## 2019-02-08 ENCOUNTER — Other Ambulatory Visit: Payer: Self-pay

## 2019-02-08 DIAGNOSIS — Z20822 Contact with and (suspected) exposure to covid-19: Secondary | ICD-10-CM

## 2019-02-08 DIAGNOSIS — H60501 Unspecified acute noninfective otitis externa, right ear: Secondary | ICD-10-CM | POA: Diagnosis not present

## 2019-02-09 LAB — NOVEL CORONAVIRUS, NAA: SARS-CoV-2, NAA: NOT DETECTED

## 2019-02-10 ENCOUNTER — Telehealth: Payer: Self-pay | Admitting: Internal Medicine

## 2019-02-10 DIAGNOSIS — F331 Major depressive disorder, recurrent, moderate: Secondary | ICD-10-CM | POA: Diagnosis not present

## 2019-02-10 NOTE — Telephone Encounter (Signed)
LMTCB

## 2019-02-10 NOTE — Telephone Encounter (Signed)
Pt returned vm from [place]. No answer on FC line x3. Please return pt's call.

## 2019-02-10 NOTE — Telephone Encounter (Signed)
Pt called about right ear infection and very painful. No appt avail. Please advise and Thank you!  Call pt @ [phone].

## 2019-02-11 DIAGNOSIS — H60501 Unspecified acute noninfective otitis externa, right ear: Secondary | ICD-10-CM | POA: Diagnosis not present

## 2019-02-11 NOTE — Telephone Encounter (Signed)
LMTCB. Called to let pt know that we received her mychart message and wanted to see how her ear was feeling.

## 2019-02-18 DIAGNOSIS — F331 Major depressive disorder, recurrent, moderate: Secondary | ICD-10-CM | POA: Diagnosis not present

## 2019-02-18 DIAGNOSIS — F5105 Insomnia due to other mental disorder: Secondary | ICD-10-CM | POA: Diagnosis not present

## 2019-02-18 DIAGNOSIS — F4011 Social phobia, generalized: Secondary | ICD-10-CM | POA: Diagnosis not present

## 2019-02-22 DIAGNOSIS — F331 Major depressive disorder, recurrent, moderate: Secondary | ICD-10-CM | POA: Diagnosis not present

## 2019-02-22 DIAGNOSIS — H60501 Unspecified acute noninfective otitis externa, right ear: Secondary | ICD-10-CM | POA: Diagnosis not present

## 2019-02-26 ENCOUNTER — Other Ambulatory Visit: Payer: Self-pay | Admitting: Internal Medicine

## 2019-02-26 NOTE — Telephone Encounter (Signed)
Refilled: 04/13/2018 Last OV: 10/07/2018 Next OV: not scheduled Last labs: 04/08/2018 CMP was abnormal

## 2019-03-01 DIAGNOSIS — F331 Major depressive disorder, recurrent, moderate: Secondary | ICD-10-CM | POA: Diagnosis not present

## 2019-03-08 DIAGNOSIS — F331 Major depressive disorder, recurrent, moderate: Secondary | ICD-10-CM | POA: Diagnosis not present

## 2019-03-15 DIAGNOSIS — F331 Major depressive disorder, recurrent, moderate: Secondary | ICD-10-CM | POA: Diagnosis not present

## 2019-03-16 ENCOUNTER — Other Ambulatory Visit: Payer: Self-pay | Admitting: Internal Medicine

## 2019-03-17 DIAGNOSIS — F4011 Social phobia, generalized: Secondary | ICD-10-CM | POA: Diagnosis not present

## 2019-03-17 DIAGNOSIS — F331 Major depressive disorder, recurrent, moderate: Secondary | ICD-10-CM | POA: Diagnosis not present

## 2019-03-17 DIAGNOSIS — F5105 Insomnia due to other mental disorder: Secondary | ICD-10-CM | POA: Diagnosis not present

## 2019-03-18 DIAGNOSIS — F4011 Social phobia, generalized: Secondary | ICD-10-CM | POA: Diagnosis not present

## 2019-03-18 DIAGNOSIS — F331 Major depressive disorder, recurrent, moderate: Secondary | ICD-10-CM | POA: Diagnosis not present

## 2019-03-22 DIAGNOSIS — F331 Major depressive disorder, recurrent, moderate: Secondary | ICD-10-CM | POA: Diagnosis not present

## 2019-03-29 DIAGNOSIS — F331 Major depressive disorder, recurrent, moderate: Secondary | ICD-10-CM | POA: Diagnosis not present

## 2019-04-05 DIAGNOSIS — F331 Major depressive disorder, recurrent, moderate: Secondary | ICD-10-CM | POA: Diagnosis not present

## 2019-04-08 DIAGNOSIS — F331 Major depressive disorder, recurrent, moderate: Secondary | ICD-10-CM | POA: Diagnosis not present

## 2019-04-08 DIAGNOSIS — F5105 Insomnia due to other mental disorder: Secondary | ICD-10-CM | POA: Diagnosis not present

## 2019-04-08 DIAGNOSIS — F4011 Social phobia, generalized: Secondary | ICD-10-CM | POA: Diagnosis not present

## 2019-04-12 DIAGNOSIS — F331 Major depressive disorder, recurrent, moderate: Secondary | ICD-10-CM | POA: Diagnosis not present

## 2019-04-19 DIAGNOSIS — F331 Major depressive disorder, recurrent, moderate: Secondary | ICD-10-CM | POA: Diagnosis not present

## 2019-04-26 DIAGNOSIS — F331 Major depressive disorder, recurrent, moderate: Secondary | ICD-10-CM | POA: Diagnosis not present

## 2019-05-03 DIAGNOSIS — F331 Major depressive disorder, recurrent, moderate: Secondary | ICD-10-CM | POA: Diagnosis not present

## 2019-05-10 DIAGNOSIS — F331 Major depressive disorder, recurrent, moderate: Secondary | ICD-10-CM | POA: Diagnosis not present

## 2019-05-17 DIAGNOSIS — F331 Major depressive disorder, recurrent, moderate: Secondary | ICD-10-CM | POA: Diagnosis not present

## 2019-05-24 DIAGNOSIS — F331 Major depressive disorder, recurrent, moderate: Secondary | ICD-10-CM | POA: Diagnosis not present

## 2019-05-26 DIAGNOSIS — F331 Major depressive disorder, recurrent, moderate: Secondary | ICD-10-CM | POA: Diagnosis not present

## 2019-05-26 DIAGNOSIS — F4011 Social phobia, generalized: Secondary | ICD-10-CM | POA: Diagnosis not present

## 2019-05-26 DIAGNOSIS — F5105 Insomnia due to other mental disorder: Secondary | ICD-10-CM | POA: Diagnosis not present

## 2019-05-27 ENCOUNTER — Ambulatory Visit: Payer: Self-pay | Attending: Internal Medicine

## 2019-05-27 DIAGNOSIS — Z23 Encounter for immunization: Secondary | ICD-10-CM

## 2019-05-27 NOTE — Progress Notes (Signed)
   Covid-19 Vaccination Clinic  Name:  Julie Hammond    MRN: [id number] DOB: [birth year]  05/27/2019  [name] was observed post Covid-19 immunization for 15 minutes without incident. She was provided with Vaccine Information Sheet and instruction to access the V-Safe system.   [name] was instructed to call 911 with any severe reactions post vaccine: Marland Kitchen Difficulty breathing  . Swelling of face and throat  . A fast heartbeat  . A bad rash all over body  . Dizziness and weakness   Immunizations Administered    Name Date Dose VIS Date Route   Pfizer COVID-19 Vaccine 05/27/2019 10:15 AM 0.3 mL 02/19/2019 Intramuscular   Manufacturer: [employer], [employer]   Lot: [id number]   NDC: [address]-7

## 2019-05-31 DIAGNOSIS — F331 Major depressive disorder, recurrent, moderate: Secondary | ICD-10-CM | POA: Diagnosis not present

## 2019-06-07 DIAGNOSIS — F331 Major depressive disorder, recurrent, moderate: Secondary | ICD-10-CM | POA: Diagnosis not present

## 2019-06-13 ENCOUNTER — Other Ambulatory Visit: Payer: Self-pay | Admitting: Internal Medicine

## 2019-06-14 DIAGNOSIS — F331 Major depressive disorder, recurrent, moderate: Secondary | ICD-10-CM | POA: Diagnosis not present

## 2019-06-21 DIAGNOSIS — F331 Major depressive disorder, recurrent, moderate: Secondary | ICD-10-CM | POA: Diagnosis not present

## 2019-06-22 ENCOUNTER — Ambulatory Visit: Payer: Self-pay | Attending: Internal Medicine

## 2019-06-22 DIAGNOSIS — Z23 Encounter for immunization: Secondary | ICD-10-CM

## 2019-06-22 NOTE — Progress Notes (Signed)
   Covid-19 Vaccination Clinic  Name:  Khristine Verno    MRN: [id number] DOB: [birth year]  06/22/2019  Ms. Olivares was observed post Covid-19 immunization for 15 minutes without incident. She was provided with Vaccine Information Sheet and instruction to access the V-Safe system.   Ms. Hustead was instructed to call 911 with any severe reactions post vaccine: Marland Kitchen Difficulty breathing  . Swelling of face and throat  . A fast heartbeat  . A bad rash all over body  . Dizziness and weakness   Immunizations Administered    Name Date Dose VIS Date Route   Pfizer COVID-19 Vaccine 06/22/2019 10:13 AM 0.3 mL 02/19/2019 Intramuscular   Manufacturer: [employer], [employer]   Lot: [id number]   NDC: [address]-7

## 2019-06-28 DIAGNOSIS — F331 Major depressive disorder, recurrent, moderate: Secondary | ICD-10-CM | POA: Diagnosis not present

## 2019-07-05 DIAGNOSIS — F331 Major depressive disorder, recurrent, moderate: Secondary | ICD-10-CM | POA: Diagnosis not present

## 2019-07-12 DIAGNOSIS — F331 Major depressive disorder, recurrent, moderate: Secondary | ICD-10-CM | POA: Diagnosis not present

## 2019-07-19 DIAGNOSIS — F331 Major depressive disorder, recurrent, moderate: Secondary | ICD-10-CM | POA: Diagnosis not present

## 2019-07-26 DIAGNOSIS — F331 Major depressive disorder, recurrent, moderate: Secondary | ICD-10-CM | POA: Diagnosis not present

## 2019-07-30 DIAGNOSIS — F5105 Insomnia due to other mental disorder: Secondary | ICD-10-CM | POA: Diagnosis not present

## 2019-07-30 DIAGNOSIS — F331 Major depressive disorder, recurrent, moderate: Secondary | ICD-10-CM | POA: Diagnosis not present

## 2019-07-30 DIAGNOSIS — F4011 Social phobia, generalized: Secondary | ICD-10-CM | POA: Diagnosis not present

## 2019-08-02 DIAGNOSIS — F331 Major depressive disorder, recurrent, moderate: Secondary | ICD-10-CM | POA: Diagnosis not present

## 2019-08-16 DIAGNOSIS — F331 Major depressive disorder, recurrent, moderate: Secondary | ICD-10-CM | POA: Diagnosis not present

## 2019-08-23 DIAGNOSIS — F331 Major depressive disorder, recurrent, moderate: Secondary | ICD-10-CM | POA: Diagnosis not present

## 2019-08-31 DIAGNOSIS — F4010 Social phobia, unspecified: Secondary | ICD-10-CM | POA: Diagnosis not present

## 2019-08-31 DIAGNOSIS — F331 Major depressive disorder, recurrent, moderate: Secondary | ICD-10-CM | POA: Diagnosis not present

## 2019-09-07 DIAGNOSIS — F900 Attention-deficit hyperactivity disorder, predominantly inattentive type: Secondary | ICD-10-CM | POA: Diagnosis not present

## 2019-09-07 DIAGNOSIS — F331 Major depressive disorder, recurrent, moderate: Secondary | ICD-10-CM | POA: Diagnosis not present

## 2019-09-07 DIAGNOSIS — F4010 Social phobia, unspecified: Secondary | ICD-10-CM | POA: Diagnosis not present

## 2019-09-08 ENCOUNTER — Other Ambulatory Visit: Payer: Self-pay | Admitting: Internal Medicine

## 2019-09-14 DIAGNOSIS — F4010 Social phobia, unspecified: Secondary | ICD-10-CM | POA: Diagnosis not present

## 2019-09-14 DIAGNOSIS — F331 Major depressive disorder, recurrent, moderate: Secondary | ICD-10-CM | POA: Diagnosis not present

## 2019-09-20 DIAGNOSIS — F4010 Social phobia, unspecified: Secondary | ICD-10-CM | POA: Diagnosis not present

## 2019-09-20 DIAGNOSIS — F900 Attention-deficit hyperactivity disorder, predominantly inattentive type: Secondary | ICD-10-CM | POA: Diagnosis not present

## 2019-09-20 DIAGNOSIS — F331 Major depressive disorder, recurrent, moderate: Secondary | ICD-10-CM | POA: Diagnosis not present

## 2019-09-28 DIAGNOSIS — F900 Attention-deficit hyperactivity disorder, predominantly inattentive type: Secondary | ICD-10-CM | POA: Diagnosis not present

## 2019-09-28 DIAGNOSIS — F4010 Social phobia, unspecified: Secondary | ICD-10-CM | POA: Diagnosis not present

## 2019-09-28 DIAGNOSIS — F331 Major depressive disorder, recurrent, moderate: Secondary | ICD-10-CM | POA: Diagnosis not present

## 2019-10-05 DIAGNOSIS — F4010 Social phobia, unspecified: Secondary | ICD-10-CM | POA: Diagnosis not present

## 2019-10-05 DIAGNOSIS — F900 Attention-deficit hyperactivity disorder, predominantly inattentive type: Secondary | ICD-10-CM | POA: Diagnosis not present

## 2019-10-05 DIAGNOSIS — F331 Major depressive disorder, recurrent, moderate: Secondary | ICD-10-CM | POA: Diagnosis not present

## 2019-10-12 DIAGNOSIS — F331 Major depressive disorder, recurrent, moderate: Secondary | ICD-10-CM | POA: Diagnosis not present

## 2019-10-12 DIAGNOSIS — F4010 Social phobia, unspecified: Secondary | ICD-10-CM | POA: Diagnosis not present

## 2019-10-12 DIAGNOSIS — F900 Attention-deficit hyperactivity disorder, predominantly inattentive type: Secondary | ICD-10-CM | POA: Diagnosis not present

## 2019-10-19 DIAGNOSIS — F900 Attention-deficit hyperactivity disorder, predominantly inattentive type: Secondary | ICD-10-CM | POA: Diagnosis not present

## 2019-10-19 DIAGNOSIS — F331 Major depressive disorder, recurrent, moderate: Secondary | ICD-10-CM | POA: Diagnosis not present

## 2019-10-19 DIAGNOSIS — F4010 Social phobia, unspecified: Secondary | ICD-10-CM | POA: Diagnosis not present

## 2019-10-26 DIAGNOSIS — F4010 Social phobia, unspecified: Secondary | ICD-10-CM | POA: Diagnosis not present

## 2019-10-26 DIAGNOSIS — F331 Major depressive disorder, recurrent, moderate: Secondary | ICD-10-CM | POA: Diagnosis not present

## 2019-10-26 DIAGNOSIS — F900 Attention-deficit hyperactivity disorder, predominantly inattentive type: Secondary | ICD-10-CM | POA: Diagnosis not present

## 2019-11-02 DIAGNOSIS — F331 Major depressive disorder, recurrent, moderate: Secondary | ICD-10-CM | POA: Diagnosis not present

## 2019-11-02 DIAGNOSIS — F900 Attention-deficit hyperactivity disorder, predominantly inattentive type: Secondary | ICD-10-CM | POA: Diagnosis not present

## 2019-11-02 DIAGNOSIS — F4010 Social phobia, unspecified: Secondary | ICD-10-CM | POA: Diagnosis not present

## 2019-11-08 DIAGNOSIS — F900 Attention-deficit hyperactivity disorder, predominantly inattentive type: Secondary | ICD-10-CM | POA: Diagnosis not present

## 2019-11-08 DIAGNOSIS — F4010 Social phobia, unspecified: Secondary | ICD-10-CM | POA: Diagnosis not present

## 2019-11-08 DIAGNOSIS — F331 Major depressive disorder, recurrent, moderate: Secondary | ICD-10-CM | POA: Diagnosis not present

## 2019-11-09 DIAGNOSIS — F4011 Social phobia, generalized: Secondary | ICD-10-CM | POA: Diagnosis not present

## 2019-11-09 DIAGNOSIS — F5105 Insomnia due to other mental disorder: Secondary | ICD-10-CM | POA: Diagnosis not present

## 2019-11-09 DIAGNOSIS — F331 Major depressive disorder, recurrent, moderate: Secondary | ICD-10-CM | POA: Diagnosis not present

## 2019-11-15 DIAGNOSIS — F331 Major depressive disorder, recurrent, moderate: Secondary | ICD-10-CM | POA: Diagnosis not present

## 2019-11-15 DIAGNOSIS — F900 Attention-deficit hyperactivity disorder, predominantly inattentive type: Secondary | ICD-10-CM | POA: Diagnosis not present

## 2019-11-15 DIAGNOSIS — F4010 Social phobia, unspecified: Secondary | ICD-10-CM | POA: Diagnosis not present

## 2019-11-23 DIAGNOSIS — F4010 Social phobia, unspecified: Secondary | ICD-10-CM | POA: Diagnosis not present

## 2019-11-23 DIAGNOSIS — F900 Attention-deficit hyperactivity disorder, predominantly inattentive type: Secondary | ICD-10-CM | POA: Diagnosis not present

## 2019-11-23 DIAGNOSIS — F331 Major depressive disorder, recurrent, moderate: Secondary | ICD-10-CM | POA: Diagnosis not present

## 2019-12-04 ENCOUNTER — Other Ambulatory Visit: Payer: Self-pay | Admitting: Internal Medicine

## 2019-12-07 DIAGNOSIS — F4010 Social phobia, unspecified: Secondary | ICD-10-CM | POA: Diagnosis not present

## 2019-12-07 DIAGNOSIS — F900 Attention-deficit hyperactivity disorder, predominantly inattentive type: Secondary | ICD-10-CM | POA: Diagnosis not present

## 2019-12-07 DIAGNOSIS — F331 Major depressive disorder, recurrent, moderate: Secondary | ICD-10-CM | POA: Diagnosis not present

## 2019-12-08 ENCOUNTER — Other Ambulatory Visit: Payer: Self-pay | Admitting: Internal Medicine

## 2019-12-21 DIAGNOSIS — F900 Attention-deficit hyperactivity disorder, predominantly inattentive type: Secondary | ICD-10-CM | POA: Diagnosis not present

## 2019-12-21 DIAGNOSIS — F4010 Social phobia, unspecified: Secondary | ICD-10-CM | POA: Diagnosis not present

## 2019-12-21 DIAGNOSIS — F331 Major depressive disorder, recurrent, moderate: Secondary | ICD-10-CM | POA: Diagnosis not present

## 2020-01-03 DIAGNOSIS — F4010 Social phobia, unspecified: Secondary | ICD-10-CM | POA: Diagnosis not present

## 2020-01-03 DIAGNOSIS — F331 Major depressive disorder, recurrent, moderate: Secondary | ICD-10-CM | POA: Diagnosis not present

## 2020-01-03 DIAGNOSIS — F900 Attention-deficit hyperactivity disorder, predominantly inattentive type: Secondary | ICD-10-CM | POA: Diagnosis not present

## 2020-01-18 ENCOUNTER — Other Ambulatory Visit: Payer: Self-pay | Admitting: Internal Medicine

## 2020-01-18 DIAGNOSIS — F331 Major depressive disorder, recurrent, moderate: Secondary | ICD-10-CM | POA: Diagnosis not present

## 2020-01-18 DIAGNOSIS — F900 Attention-deficit hyperactivity disorder, predominantly inattentive type: Secondary | ICD-10-CM | POA: Diagnosis not present

## 2020-01-18 DIAGNOSIS — F4010 Social phobia, unspecified: Secondary | ICD-10-CM | POA: Diagnosis not present

## 2020-01-24 ENCOUNTER — Other Ambulatory Visit: Payer: Self-pay

## 2020-01-24 ENCOUNTER — Encounter: Payer: Self-pay | Admitting: Internal Medicine

## 2020-01-24 ENCOUNTER — Ambulatory Visit (INDEPENDENT_AMBULATORY_CARE_PROVIDER_SITE_OTHER): Payer: BC Managed Care – PPO | Admitting: Internal Medicine

## 2020-01-24 VITALS — BP 118/86 | HR 88 | Temp 98.3°F | Resp 15 | Ht 63.0 in | Wt 297.0 lb

## 2020-01-24 DIAGNOSIS — R430 Anosmia: Secondary | ICD-10-CM

## 2020-01-24 DIAGNOSIS — E282 Polycystic ovarian syndrome: Secondary | ICD-10-CM

## 2020-01-24 DIAGNOSIS — F339 Major depressive disorder, recurrent, unspecified: Secondary | ICD-10-CM | POA: Diagnosis not present

## 2020-01-24 DIAGNOSIS — D509 Iron deficiency anemia, unspecified: Secondary | ICD-10-CM | POA: Diagnosis not present

## 2020-01-24 DIAGNOSIS — E669 Obesity, unspecified: Secondary | ICD-10-CM | POA: Diagnosis not present

## 2020-01-24 DIAGNOSIS — Z0001 Encounter for general adult medical examination with abnormal findings: Secondary | ICD-10-CM | POA: Diagnosis not present

## 2020-01-24 DIAGNOSIS — E559 Vitamin D deficiency, unspecified: Secondary | ICD-10-CM | POA: Diagnosis not present

## 2020-01-24 DIAGNOSIS — Z23 Encounter for immunization: Secondary | ICD-10-CM

## 2020-01-24 NOTE — Patient Instructions (Signed)
You ARE losing weight!  Don't give up!  Avoid pineapple,  banana, watermelon and grapes  Healthy choice low carb power bowls and lean kitchen  has chorizo sausage bowl  Work your way up to 30 minutes on the bike.     I'm ordering a CT scan of your sinuses to evaluate your change in smell/taste

## 2020-01-24 NOTE — Progress Notes (Addendum)
Patient ID: Julie Hammond, female    DOB: 05/02/1990  Age: 29 y.o. MRN: [id number]  The patient is here for annual  wellness examination and management of other chronic and acute problems.  This visit occurred during the SARS-CoV-2 public health emergency.  Safety protocols were in place, including screening questions prior to the visit, additional usage of staff PPE, and extensive cleaning of exam room while observing appropriate contact time as indicated for disinfecting solutions.    Patient has received both doses of the available COVID 19 vaccine without complications.  Patient continues to mask when outside of the home except when walking in yard or at safe distances from others .  Patient denies any change in mood or development of unhealthy behaviors resuting from the pandemic's restriction of activities and socialization.      The risk factors are reflected in the social history.  The roster of all physicians providing medical care to patient - is listed in the Snapshot section of the chart.  Activities of daily living:  The patient is 100% independent in all ADLs: dressing, toileting, feeding as well as independent mobility  Home safety : The patient has smoke detectors in the home. They wear seatbelts.  There are no firearms at home. There is no violence in the home.   There is no risks for hepatitis, STDs or HIV. There is no   history of blood transfusion. They have no travel history to infectious disease endemic areas of the world.  The patient has seen their dentist in the last six month. They have seen their eye doctor in the last year. They admit to slight hearing difficulty with regard to whispered voices and some television programs.  They have deferred audiologic testing in the last year.  They do not  have excessive sun exposure. Discussed the need for sun protection: hats, long sleeves and use of sunscreen if there is significant sun exposure.   Diet: the importance of  a healthy diet is discussed. They do have a healthy diet.  The benefits of regular aerobic exercise were discussed. She walks 4 times per week ,  20 minutes.   Depression screen: there are no signs or vegative symptoms of depression- irritability, change in appetite, anhedonia, sadness/tearfullness.  Cognitive assessment: the patient manages all their financial and personal affairs and is actively engaged. They could relate day,date,year and events; recalled 2/3 objects at 3 minutes; performed clock-face test normally.  The following portions of the patient's history were reviewed and updated as appropriate: allergies, current medications, past family history, past medical history,  past surgical history, past social history  and problem list.  Visual acuity was not assessed per patient preference since she has regular follow up with her ophthalmologist. Hearing and body mass index were assessed and reviewed.   During the course of the visit the patient was educated and counseled about appropriate screening and preventive services including : fall prevention , diabetes screening, nutrition counseling, colorectal cancer screening, and recommended immunizations.    CC: The primary encounter diagnosis was Major depressive disorder, recurrent episode with melancholic features (HCC). Diagnoses of Iron deficiency anemia, unspecified iron deficiency anemia type, Vitamin D deficiency, Obesity (BMI 30-39.9), Anosmia, COVID-19 vaccine regimen to maintain immunity completed, Encounter for general adult medical examination with abnormal findings, and Morbid obesity (HCC) were also pertinent to this visit.   Depression :  Seeing dR Kapur . Referred for ADD testing but has yet to make appointmebt   Had wisdom teeth  removed( 2) in May   Cc:   Sense of taste and smell has become altered. And is getting worse. .  Started in mid February  with chocolate and peanuts .  Had a severe otitis externa right ear Dec  2021,  Was treated at [facility] in  For infection.  Headache history  Is 1-2 times per week, without vision changes or dizziness.  No sinus symptoms or facial pain.   Last PAP 2019 by Beatris Si  Obesity:  Discouraged by inability to lose weight.  Using recumbent bike for 20 minutes every other day and resistance bands the other days.  Gets bored after 15 minutes.  Following  A low carb diet,  Using fruit for sweets.    History Tomorrow has a past medical history of Allergy, Anxiety, Chicken pox, Depression, Hypertension, POTS (postural orthostatic tachycardia syndrome), and Syncope and collapse.   She has a past surgical history that includes No past surgeries.   Her family history includes Obesity in her father and mother.She reports that she has never smoked. She has never used smokeless tobacco. She reports current alcohol use. She reports that she does not use drugs.  Outpatient Medications Prior to Visit  Medication Sig Dispense Refill  . atenolol (TENORMIN) 50 MG tablet TAKE 1 TABLET BY MOUTH TWICE A DAY 180 tablet 1  . buPROPion (WELLBUTRIN XL) 300 MG 24 hr tablet Take 300 mg by mouth daily.    . JUNEL FE 1.5/30 1.5-30 MG-MCG tablet TAKE 1 TABLET BY MOUTH EVERY DAY 84 tablet 3  . sertraline (ZOLOFT) 100 MG tablet Take by mouth.    Marland Kitchen TANDEM 162-115.2 MG CAPS capsule TAKE 1 CAPSULE BY MOUTH DAILY WITH BREAKFAST. NEEDS APPT FOR FURTHER REFILL 90 capsule 0  . buPROPion (WELLBUTRIN XL) 150 MG 24 hr tablet TAKE 1 TABLET BY MOUTH EVERY DAY (Patient not taking: Reported on 01/24/2020) 90 tablet 1  . ergocalciferol (DRISDOL) 1.25 MG (50000 UT) capsule Take 1 capsule (50,000 Units total) by mouth once a week. (Patient not taking: Reported on 01/24/2020) 12 capsule 3  . Multiple Vitamin (MULTIVITAMIN) tablet Take 1 tablet by mouth daily. (Patient not taking: Reported on 01/24/2020)    . venlafaxine XR (EFFEXOR-XR) 75 MG 24 hr capsule TAKE 1 CAPSULE BY MOUTH EVERY DAY WITH BREAKFAST (Patient  not taking: Reported on 01/24/2020) 90 capsule 1   No facility-administered medications prior to visit.    Review of Systems  Patient denies headache, fevers, malaise, unintentional weight loss, skin rash, eye pain, sinus congestion and sinus pain, sore throat, dysphagia,  hemoptysis , cough, dyspnea, wheezing, chest pain, palpitations, orthopnea, edema, abdominal pain, nausea, melena, diarrhea, constipation, flank pain, dysuria, hematuria, urinary  Frequency, nocturia, numbness, tingling, seizures,  Focal weakness, Loss of consciousness,  Tremor, insomnia, depression, anxiety, and suicidal ideation.     Objective:  BP 118/86 (BP Location: Left Arm, Patient Position: Sitting, Cuff Size: Large)   Pulse 88   Temp 98.3 F (36.8 C) (Oral)   Resp 15   Ht 5\' 3"  (1.6 m)   Wt 297 lb (134.7 kg)   SpO2 99%   BMI 52.61 kg/m   Physical Exam  General appearance: morbidly obese, alert, cooperative and appears stated age Head: Normocephalic, without obvious abnormality, atraumatic Eyes: conjunctivae/corneas clear. PERRL, EOM's intact. Fundi benign. Ears: normal TM's and external ear canals both ears Nose: Nares normal. Septum midline. Mucosa normal. No drainage or sinus tenderness. Throat: lips, mucosa, and tongue normal; teeth and gums normal  Neck: no adenopathy, no carotid bruit, no JVD, supple, symmetrical, trachea midline and thyroid not enlarged, symmetric, no tenderness/mass/nodules Lungs: clear to auscultation bilaterally Breasts: normal appearance, no masses or tenderness Heart: regular rate and rhythm, S1, S2 normal, no murmur, click, rub or gallop Abdomen: soft, non-tender; bowel sounds normal; no masses,  no organomegaly Extremities: extremities normal, atraumatic, no cyanosis or edema Pulses: 2+ and symmetric Skin: Skin color, texture, turgor normal. No rashes or lesions Neurologic: Alert and oriented X 3, normal strength and tone. Normal symmetric reflexes. Normal coordination and  gait.    Assessment & Plan:   Problem List Items Addressed This Visit      Unprioritized   Anosmia    Etiology unclear.  Loss of smell and taste has been progressive over the past year.  Not acute. MRI brain ordered to rule out mass       Relevant Orders   MR Brain W Wo Contrast   Encounter for general adult medical examination with abnormal findings    age appropriate education and counseling updated, referrals for preventative services and immunizations addressed, dietary and smoking counseling addressed, most recent labs reviewed.  I have personally reviewed and have noted:  1) the patient's medical and social history 2) The pt's use of alcohol, tobacco, and illicit drugs 3) The patient's current medications and supplements 4) Functional ability including ADL's, fall risk, home safety risk, hearing and visual impairment 5) Diet and physical activities 6) Evidence for depression or mood disorder 7) The patient's height, weight, and BMI have been recorded in the chart  I have made referrals, and provided counseling and education based on review of the above      Iron deficiency anemia   Relevant Orders   CBC with Differential/Platelet (Completed)   IBC + Ferritin (Completed)   Major depressive disorder, recurrent episode with melancholic features (HCC) - Primary    Seeing  Dr Maryruth Bun ,  Appears stable and much happier than at last visit.       Relevant Medications   buPROPion (WELLBUTRIN XL) 300 MG 24 hr tablet   sertraline (ZOLOFT) 100 MG tablet   Morbid obesity (HCC)    I have addressed  BMI > 50  and recommended wt loss of 10% of body weigh over the next 6 months using a low glycemic index diet and regular exercise a minimum of 5 days per week.        Vitamin D deficiency   Relevant Orders   VITAMIN D 25 Hydroxy (Vit-D Deficiency, Fractures) (Completed)    Other Visit Diagnoses    COVID-19 vaccine regimen to maintain immunity completed       Relevant Orders    SARS-CoV-2 Semi-Quantitative Total Antibody, Spike (Completed)      I have discontinued Julie Hammond's multivitamin, ergocalciferol, and venlafaxine XR. I am also having her maintain her Tandem, atenolol, Junel FE 1.5/30, buPROPion, and sertraline.  No orders of the defined types were placed in this encounter.   Medications Discontinued During This Encounter  Medication Reason  . venlafaxine XR (EFFEXOR-XR) 75 MG 24 hr capsule Change in therapy  . Multiple Vitamin (MULTIVITAMIN) tablet   . ergocalciferol (DRISDOL) 1.25 MG (50000 UT) capsule Completed Course  . buPROPion (WELLBUTRIN XL) 150 MG 24 hr tablet Change in therapy    Follow-up: No follow-ups on file.   Sherlene Shams, MD

## 2020-01-25 DIAGNOSIS — Z0001 Encounter for general adult medical examination with abnormal findings: Secondary | ICD-10-CM | POA: Insufficient documentation

## 2020-01-25 DIAGNOSIS — R430 Anosmia: Secondary | ICD-10-CM | POA: Insufficient documentation

## 2020-01-25 LAB — CBC WITH DIFFERENTIAL/PLATELET
Basophils Absolute: 0.1 10*3/uL (ref 0.0–0.1)
Basophils Relative: 1.1 % (ref 0.0–3.0)
Eosinophils Absolute: 0 10*3/uL (ref 0.0–0.7)
Eosinophils Relative: 0.7 % (ref 0.0–5.0)
HCT: 35.7 % — ABNORMAL LOW (ref 36.0–46.0)
Hemoglobin: 11.3 g/dL — ABNORMAL LOW (ref 12.0–15.0)
Lymphocytes Relative: 23.9 % (ref 12.0–46.0)
Lymphs Abs: 1.7 10*3/uL (ref 0.7–4.0)
MCHC: 31.7 g/dL (ref 30.0–36.0)
MCV: 67.7 fl — ABNORMAL LOW (ref 78.0–100.0)
Monocytes Absolute: 0.4 10*3/uL (ref 0.1–1.0)
Monocytes Relative: 6 % (ref 3.0–12.0)
Neutro Abs: 4.9 10*3/uL (ref 1.4–7.7)
Neutrophils Relative %: 68.3 % (ref 43.0–77.0)
Platelets: 278 10*3/uL (ref 150.0–400.0)
RBC: 5.28 Mil/uL — ABNORMAL HIGH (ref 3.87–5.11)
RDW: 18.9 % — ABNORMAL HIGH (ref 11.5–15.5)
WBC: 7.2 10*3/uL (ref 4.0–10.5)

## 2020-01-25 LAB — IBC + FERRITIN
Ferritin: 6 ng/mL — ABNORMAL LOW (ref 10.0–291.0)
Iron: 22 ug/dL — ABNORMAL LOW (ref 42–145)
Saturation Ratios: 4.1 % — ABNORMAL LOW (ref 20.0–50.0)
Transferrin: 382 mg/dL — ABNORMAL HIGH (ref 212.0–360.0)

## 2020-01-25 LAB — COMPREHENSIVE METABOLIC PANEL
ALT: 13 U/L (ref 0–35)
AST: 13 U/L (ref 0–37)
Albumin: 3.6 g/dL (ref 3.5–5.2)
Alkaline Phosphatase: 34 U/L — ABNORMAL LOW (ref 39–117)
BUN: 9 mg/dL (ref 6–23)
CO2: 24 mEq/L (ref 19–32)
Calcium: 8.5 mg/dL (ref 8.4–10.5)
Chloride: 107 mEq/L (ref 96–112)
Creatinine, Ser: 0.91 mg/dL (ref 0.40–1.20)
GFR: 85.97 mL/min (ref >60.00)
Glucose, Bld: 92 mg/dL (ref 70–99)
Potassium: 3.8 mEq/L (ref 3.5–5.1)
Sodium: 138 mEq/L (ref 135–145)
Total Bilirubin: 0.3 mg/dL (ref 0.2–1.2)
Total Protein: 6.5 g/dL (ref 6.0–8.3)

## 2020-01-25 LAB — VITAMIN D 25 HYDROXY (VIT D DEFICIENCY, FRACTURES): VITD: 7.5 ng/mL — ABNORMAL LOW (ref 30.00–100.00)

## 2020-01-25 LAB — LIPID PANEL
Cholesterol: 153 mg/dL (ref 0–200)
HDL: 44.1 mg/dL (ref >39.00)
LDL Cholesterol: 83 mg/dL (ref 0–99)
NonHDL: 109.03
Total CHOL/HDL Ratio: 3
Triglycerides: 129 mg/dL (ref 0.0–149.0)
VLDL: 25.8 mg/dL (ref 0.0–40.0)

## 2020-01-25 LAB — LDL CHOLESTEROL, DIRECT: Direct LDL: 100 mg/dL

## 2020-01-25 LAB — HEMOGLOBIN A1C: Hgb A1c MFr Bld: 5.1 % (ref 4.6–6.5)

## 2020-01-25 LAB — SARS-COV-2 SEMI-QUANTITATIVE TOTAL ANTIBODY, SPIKE
SARS-CoV-2 Semi-Quant Total Ab: 414.8 U/mL (ref <0.8)
SARS-CoV-2 Spike Ab Interp: POSITIVE

## 2020-01-25 LAB — TSH: TSH: 1.87 u[IU]/mL (ref 0.35–4.50)

## 2020-01-25 NOTE — Assessment & Plan Note (Signed)
Continue oral hormonal birth control.

## 2020-01-25 NOTE — Assessment & Plan Note (Signed)

## 2020-01-25 NOTE — Assessment & Plan Note (Addendum)
I have addressed  BMI > 50  and recommended wt loss of 10% of body weigh over the next 6 months using a low glycemic index diet and regular exercise a minimum of 5 days per week.

## 2020-01-25 NOTE — Assessment & Plan Note (Signed)
Seeing  Dr Kapur ,  Appears stable and much happier than at last visit.  

## 2020-01-25 NOTE — Assessment & Plan Note (Signed)
Etiology unclear.  Loss of smell and taste has been progressive over the past year.  Not acute. MRI brain ordered to rule out mass

## 2020-01-26 ENCOUNTER — Telehealth: Payer: Self-pay | Admitting: Internal Medicine

## 2020-01-26 NOTE — Telephone Encounter (Signed)
lft vm for pt to call ofc 

## 2020-01-27 ENCOUNTER — Other Ambulatory Visit: Payer: Self-pay | Admitting: Internal Medicine

## 2020-01-27 DIAGNOSIS — E559 Vitamin D deficiency, unspecified: Secondary | ICD-10-CM

## 2020-01-27 DIAGNOSIS — D509 Iron deficiency anemia, unspecified: Secondary | ICD-10-CM

## 2020-01-27 MED ORDER — ERGOCALCIFEROL 1.25 MG (50000 UT) PO CAPS: 50000.0000 [IU] | ORAL_CAPSULE | ORAL | 0 refills | Status: DC

## 2020-01-27 MED ORDER — TANDEM 162-115.2 MG PO CAPS
1.0000 | ORAL_CAPSULE | Freq: Every day | ORAL | 0 refills | Status: DC
Start: 2020-01-27 — End: 2020-05-09

## 2020-01-27 NOTE — Assessment & Plan Note (Signed)
Still low.  Tandem represcribed

## 2020-01-27 NOTE — Assessment & Plan Note (Signed)
Recurrent,  Severe.

## 2020-01-27 NOTE — Progress Notes (Signed)
  Your vitamin D is  extremely low .   I am calling in a megadose of Vit D to take once weekly for a total of 3 months.    Your additional labs indicate your iron stores are slightly low, AN YOU ARE ANEMIC (mildly).  If you can tolerate a daily OTC iron supplement without nausea or constipation, I would recommend  resuming the tandem  once daily with a meal or some orange juice.  Your other labs are normal.   Return in 3 months for repeat labs.   Regards,  Dr. Darrick Huntsman  Regards,      Duncan Dull, MD

## 2020-02-01 DIAGNOSIS — F900 Attention-deficit hyperactivity disorder, predominantly inattentive type: Secondary | ICD-10-CM | POA: Diagnosis not present

## 2020-02-01 DIAGNOSIS — F331 Major depressive disorder, recurrent, moderate: Secondary | ICD-10-CM | POA: Diagnosis not present

## 2020-02-01 DIAGNOSIS — F4010 Social phobia, unspecified: Secondary | ICD-10-CM | POA: Diagnosis not present

## 2020-02-07 DIAGNOSIS — F4011 Social phobia, generalized: Secondary | ICD-10-CM | POA: Diagnosis not present

## 2020-02-07 DIAGNOSIS — F331 Major depressive disorder, recurrent, moderate: Secondary | ICD-10-CM | POA: Diagnosis not present

## 2020-02-07 DIAGNOSIS — F5105 Insomnia due to other mental disorder: Secondary | ICD-10-CM | POA: Diagnosis not present

## 2020-02-08 ENCOUNTER — Ambulatory Visit
Admission: RE | Admit: 2020-02-08 | Discharge: 2020-02-08 | Disposition: A | Discharge status: Home / Self Care | Payer: BC Managed Care – PPO | Source: Ambulatory Visit | Attending: Internal Medicine | Admitting: Internal Medicine

## 2020-02-08 ENCOUNTER — Other Ambulatory Visit: Payer: Self-pay

## 2020-02-08 DIAGNOSIS — R430 Anosmia: Secondary | ICD-10-CM | POA: Insufficient documentation

## 2020-02-08 DIAGNOSIS — J342 Deviated nasal septum: Secondary | ICD-10-CM | POA: Diagnosis not present

## 2020-02-08 DIAGNOSIS — R439 Unspecified disturbances of smell and taste: Secondary | ICD-10-CM | POA: Diagnosis not present

## 2020-02-08 DIAGNOSIS — J341 Cyst and mucocele of nose and nasal sinus: Secondary | ICD-10-CM | POA: Diagnosis not present

## 2020-02-08 MED ORDER — GADOBUTROL 1 MMOL/ML IV SOLN
10.0000 mL | Freq: Once | INTRAVENOUS | Status: AC | PRN
  Administered 2020-02-08: 10 mL via INTRAVENOUS

## 2020-02-09 NOTE — Progress Notes (Signed)
Your MRI brain was normal. Nothing to explain your loss of taste and smell.  You have a deviated septum , but that's an incidental finding and does not need attention unless you are having recurrent sinus infections.  Regards,   Duncan Dull, MD

## 2020-02-15 DIAGNOSIS — F900 Attention-deficit hyperactivity disorder, predominantly inattentive type: Secondary | ICD-10-CM | POA: Diagnosis not present

## 2020-02-15 DIAGNOSIS — F4010 Social phobia, unspecified: Secondary | ICD-10-CM | POA: Diagnosis not present

## 2020-02-15 DIAGNOSIS — F331 Major depressive disorder, recurrent, moderate: Secondary | ICD-10-CM | POA: Diagnosis not present

## 2020-02-29 DIAGNOSIS — F331 Major depressive disorder, recurrent, moderate: Secondary | ICD-10-CM | POA: Diagnosis not present

## 2020-02-29 DIAGNOSIS — F900 Attention-deficit hyperactivity disorder, predominantly inattentive type: Secondary | ICD-10-CM | POA: Diagnosis not present

## 2020-02-29 DIAGNOSIS — F4010 Social phobia, unspecified: Secondary | ICD-10-CM | POA: Diagnosis not present

## 2020-03-14 DIAGNOSIS — F900 Attention-deficit hyperactivity disorder, predominantly inattentive type: Secondary | ICD-10-CM | POA: Diagnosis not present

## 2020-03-14 DIAGNOSIS — F4010 Social phobia, unspecified: Secondary | ICD-10-CM | POA: Diagnosis not present

## 2020-03-14 DIAGNOSIS — F331 Major depressive disorder, recurrent, moderate: Secondary | ICD-10-CM | POA: Diagnosis not present

## 2020-03-28 DIAGNOSIS — F4010 Social phobia, unspecified: Secondary | ICD-10-CM | POA: Diagnosis not present

## 2020-03-28 DIAGNOSIS — F900 Attention-deficit hyperactivity disorder, predominantly inattentive type: Secondary | ICD-10-CM | POA: Diagnosis not present

## 2020-03-28 DIAGNOSIS — F331 Major depressive disorder, recurrent, moderate: Secondary | ICD-10-CM | POA: Diagnosis not present

## 2020-04-11 DIAGNOSIS — F4010 Social phobia, unspecified: Secondary | ICD-10-CM | POA: Diagnosis not present

## 2020-04-11 DIAGNOSIS — F900 Attention-deficit hyperactivity disorder, predominantly inattentive type: Secondary | ICD-10-CM | POA: Diagnosis not present

## 2020-04-11 DIAGNOSIS — F331 Major depressive disorder, recurrent, moderate: Secondary | ICD-10-CM | POA: Diagnosis not present

## 2020-04-17 ENCOUNTER — Other Ambulatory Visit: Payer: Self-pay | Admitting: Internal Medicine

## 2020-04-25 DIAGNOSIS — F331 Major depressive disorder, recurrent, moderate: Secondary | ICD-10-CM | POA: Diagnosis not present

## 2020-04-25 DIAGNOSIS — F4010 Social phobia, unspecified: Secondary | ICD-10-CM | POA: Diagnosis not present

## 2020-04-25 DIAGNOSIS — F900 Attention-deficit hyperactivity disorder, predominantly inattentive type: Secondary | ICD-10-CM | POA: Diagnosis not present

## 2020-05-01 DIAGNOSIS — F331 Major depressive disorder, recurrent, moderate: Secondary | ICD-10-CM | POA: Diagnosis not present

## 2020-05-01 DIAGNOSIS — F4011 Social phobia, generalized: Secondary | ICD-10-CM | POA: Diagnosis not present

## 2020-05-01 DIAGNOSIS — F5105 Insomnia due to other mental disorder: Secondary | ICD-10-CM | POA: Diagnosis not present

## 2020-05-05 ENCOUNTER — Other Ambulatory Visit (INDEPENDENT_AMBULATORY_CARE_PROVIDER_SITE_OTHER): Payer: BC Managed Care – PPO

## 2020-05-05 ENCOUNTER — Other Ambulatory Visit: Payer: Self-pay

## 2020-05-05 DIAGNOSIS — E559 Vitamin D deficiency, unspecified: Secondary | ICD-10-CM | POA: Diagnosis not present

## 2020-05-05 DIAGNOSIS — D509 Iron deficiency anemia, unspecified: Secondary | ICD-10-CM | POA: Diagnosis not present

## 2020-05-06 LAB — CBC WITH DIFFERENTIAL/PLATELET
Absolute Monocytes: 398 cells/uL (ref 200–950)
Basophils Absolute: 8 cells/uL (ref 0–200)
Basophils Relative: 0.1 %
Eosinophils Absolute: 60 cells/uL (ref 15–500)
Eosinophils Relative: 0.8 %
HCT: 36.2 % (ref 35.0–45.0)
Hemoglobin: 11.3 g/dL — ABNORMAL LOW (ref 11.7–15.5)
Lymphs Abs: 2190 cells/uL (ref 850–3900)
MCH: 22 pg — ABNORMAL LOW (ref 27.0–33.0)
MCHC: 31.2 g/dL — ABNORMAL LOW (ref 32.0–36.0)
MCV: 70.6 fL — ABNORMAL LOW (ref 80.0–100.0)
MPV: 11.1 fL (ref 7.5–12.5)
Monocytes Relative: 5.3 %
Neutro Abs: 4845 cells/uL (ref 1500–7800)
Neutrophils Relative %: 64.6 %
Platelets: 283 10*3/uL (ref 140–400)
RBC: 5.13 10*6/uL — ABNORMAL HIGH (ref 3.80–5.10)
RDW: 17 % — ABNORMAL HIGH (ref 11.0–15.0)
Total Lymphocyte: 29.2 %
WBC: 7.5 10*3/uL (ref 3.8–10.8)

## 2020-05-06 LAB — IRON,TIBC AND FERRITIN PANEL
%SAT: 4 % (calc) — ABNORMAL LOW (ref 16–45)
Ferritin: 5 ng/mL — ABNORMAL LOW (ref 16–154)
Iron: 22 ug/dL — ABNORMAL LOW (ref 40–190)
TIBC: 530 mcg/dL (calc) — ABNORMAL HIGH (ref 250–450)

## 2020-05-06 LAB — VITAMIN D 25 HYDROXY (VIT D DEFICIENCY, FRACTURES): Vit D, 25-Hydroxy: 11 ng/mL — ABNORMAL LOW (ref 30–100)

## 2020-05-09 ENCOUNTER — Other Ambulatory Visit: Payer: Self-pay | Admitting: Internal Medicine

## 2020-05-09 DIAGNOSIS — F900 Attention-deficit hyperactivity disorder, predominantly inattentive type: Secondary | ICD-10-CM | POA: Diagnosis not present

## 2020-05-09 DIAGNOSIS — D509 Iron deficiency anemia, unspecified: Secondary | ICD-10-CM

## 2020-05-09 DIAGNOSIS — F331 Major depressive disorder, recurrent, moderate: Secondary | ICD-10-CM | POA: Diagnosis not present

## 2020-05-09 DIAGNOSIS — F4010 Social phobia, unspecified: Secondary | ICD-10-CM | POA: Diagnosis not present

## 2020-05-23 DIAGNOSIS — F331 Major depressive disorder, recurrent, moderate: Secondary | ICD-10-CM | POA: Diagnosis not present

## 2020-05-23 DIAGNOSIS — F900 Attention-deficit hyperactivity disorder, predominantly inattentive type: Secondary | ICD-10-CM | POA: Diagnosis not present

## 2020-05-23 DIAGNOSIS — F4010 Social phobia, unspecified: Secondary | ICD-10-CM | POA: Diagnosis not present

## 2020-05-24 ENCOUNTER — Inpatient Hospital Stay: Payer: BC Managed Care – PPO

## 2020-05-24 ENCOUNTER — Inpatient Hospital Stay: Payer: BC Managed Care – PPO | Attending: Oncology | Admitting: Oncology

## 2020-05-24 ENCOUNTER — Encounter: Payer: Self-pay | Admitting: Oncology

## 2020-05-24 VITALS — BP 158/95 | HR 84 | Temp 99.4°F | Resp 18 | Wt 284.3 lb

## 2020-05-24 DIAGNOSIS — I10 Essential (primary) hypertension: Secondary | ICD-10-CM | POA: Insufficient documentation

## 2020-05-24 DIAGNOSIS — D508 Other iron deficiency anemias: Secondary | ICD-10-CM | POA: Diagnosis not present

## 2020-05-24 DIAGNOSIS — Z79899 Other long term (current) drug therapy: Secondary | ICD-10-CM | POA: Diagnosis not present

## 2020-05-24 DIAGNOSIS — R0602 Shortness of breath: Secondary | ICD-10-CM | POA: Insufficient documentation

## 2020-05-24 NOTE — Progress Notes (Signed)
Patient here to establish care for IDA.  °

## 2020-05-24 NOTE — Progress Notes (Signed)
Hematology/Oncology Consult note [facility] [phone] [phone]   Patient Care Team: Sherlene Shams, MD as PCP - General (Internal Medicine)  REFERRING PROVIDER: Sherlene Shams, MD CHIEF COMPLAINTS/REASON FOR VISIT:  Evaluation of iron deficiency anemia  HISTORY OF PRESENTING ILLNESS:  Julie Hammond is a  30 y.o.  female with PMH listed below was seen in consultation at the request of Sherlene Shams, MD   for evaluation of iron deficiency anemia.   Reviewed patient's recent labs  05/05/2020 labs revealed anemia with hemoglobin of 11.3, ferritin 5, iron saturation 4, TIBC 530.Marland Kitchen   Reviewed patient's previous labs ordered by primary care physician's office, anemia is chronic onset , duration is since 2017.  Chronic iron deficiency No aggravating or improving factors.  Associated signs and symptoms: Patient reports fatigue.  SOB with exertion.  Denies weight loss, easy bruising, hematochezia, hemoptysis, hematuria. Context:  History of iron deficiency: Chronic iron deficiency.  Patient currently takes over-the-counter iron fumarate 18 mg daily. Rectal bleeding: Denies Menstrual bleeding/ Vaginal bleeding : She reports that she used to have menorrhagia.  After started on OCP,.  Now her period last 4-5 days, normal flow. Hematemesis or hemoptysis : denie Blood in urine : denies    Review of Systems  Constitutional: Positive for fatigue. Negative for appetite change, chills and fever.  HENT:   Negative for hearing loss and voice change.   Eyes: Negative for eye problems.  Respiratory: Negative for chest tightness and cough.   Cardiovascular: Negative for chest pain.  Gastrointestinal: Negative for abdominal distention, abdominal pain and blood in stool.  Endocrine: Negative for hot flashes.  Genitourinary: Negative for difficulty urinating and frequency.   Musculoskeletal: Negative for arthralgias.  Skin: Negative for itching  and rash.  Neurological: Negative for extremity weakness.  Hematological: Negative for adenopathy.  Psychiatric/Behavioral: Negative for confusion.    MEDICAL HISTORY:  Past Medical History:  Diagnosis Date  . Allergy   . Anxiety   . Chicken pox   . Depression   . Hypertension   . POTS (postural orthostatic tachycardia syndrome)   . Syncope and collapse     SURGICAL HISTORY: Past Surgical History:  Procedure Laterality Date  . NO PAST SURGERIES      SOCIAL HISTORY: Social History   Socioeconomic History  . Marital status: Significant Other    Spouse name: Not on file  . Number of children: Not on file  . Years of education: Not on file  . Highest education level: Not on file  Occupational History  . Not on file  Tobacco Use  . Smoking status: Never Smoker  . Smokeless tobacco: Never Used  Vaping Use  . Vaping Use: Never used  Substance and Sexual Activity  . Alcohol use: Yes    Comment: occasional   . Drug use: No  . Sexual activity: Yes    Birth control/protection: Pill, Condom  Other Topics Concern  . 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 Obesity Mother   .  Obesity Father   . Breast cancer Neg Hx   . Ovarian cancer Neg Hx   . Colon cancer Neg Hx   . Heart disease Neg Hx     ALLERGIES:  has No Known Allergies.  MEDICATIONS:  Current Outpatient Medications  Medication Sig Dispense Refill  . atenolol (TENORMIN) 50 MG tablet TAKE 1 TABLET BY MOUTH TWICE A DAY 180 tablet 1  . buPROPion (WELLBUTRIN XL) 300 MG 24 hr tablet Take  300 mg by mouth daily.    . JUNEL FE 1.5/30 1.5-30 MG-MCG tablet TAKE 1 TABLET BY MOUTH EVERY DAY 84 tablet 3  . sertraline (ZOLOFT) 100 MG tablet Take by mouth.    . Vitamin D, Ergocalciferol, (DRISDOL) 1.25 MG (50000 UNIT) CAPS capsule TAKE 1 CAPSULE BY MOUTH ONE TIME PER WEEK 12 capsule 0   No current facility-administered medications for this visit.     PHYSICAL EXAMINATION: ECOG PERFORMANCE STATUS: 1 - Symptomatic but completely ambulatory Vitals:   05/24/20 1432  BP: (!) 158/95  Pulse: 84  Resp: 18  Temp: 99.4 F (37.4 C)   Filed Weights   05/24/20 1432  Weight: 284 lb 4.8 oz (129 kg)    Physical Exam Constitutional:      General: She is not in acute distress. HENT:     Head: Normocephalic and atraumatic.  Eyes:     General: No scleral icterus. Cardiovascular:     Rate and Rhythm: Normal rate and regular rhythm.     Heart sounds: Normal heart sounds.  Pulmonary:     Effort: Pulmonary effort is normal. No respiratory distress.     Breath sounds: No wheezing.  Abdominal:     General: Bowel sounds are normal. There is no distension.     Palpations: Abdomen is soft.  Musculoskeletal:        General: No deformity. Normal range of motion.     Cervical back: Normal range of motion and neck supple.  Skin:    General: Skin is warm and dry.     Findings: No erythema or rash.  Neurological:     Mental Status: She is alert and oriented to person, place, and time. Mental status is at baseline.     Cranial Nerves: No cranial nerve deficit.     Coordination: Coordination normal.  Psychiatric:        Mood and Affect: Mood normal.       CMP Latest Ref Rng & Units 01/24/2020  Glucose 70 - 99 mg/dL 92  BUN 6 - 23 mg/dL 9  Creatinine 0.08 - 6.76 mg/dL 1.95  Sodium 093 - 267 mEq/L 138  Potassium 3.5 - 5.1 mEq/L 3.8  Chloride 96 - 112 mEq/L 107  CO2 19 - 32 mEq/L 24  Calcium 8.4 - 10.5 mg/dL 8.5  Total Protein 6.0 - 8.3 g/dL 6.5  Total Bilirubin 0.2 - 1.2 mg/dL 0.3   Alkaline Phos 39 - 117 U/L 34(L)  AST 0 - 37 U/L 13  ALT 0 - 35 U/L 13   CBC Latest Ref Rng & Units 05/05/2020  WBC 3.8 - 10.8 Thousand/uL 7.5  Hemoglobin 11.7 - 15.5 g/dL 11.3(L)  Hematocrit 35.0 - 45.0 % 36.2  Platelets 140 - 400 Thousand/uL 283     LABORATORY DATA:  I have reviewed the data as listed Lab Results  Component Value Date   WBC 7.5 05/05/2020   HGB 11.3 (L) 05/05/2020   HCT 36.2 05/05/2020   MCV 70.6 (L) 05/05/2020   PLT  283 05/05/2020   Recent Labs    01/24/20 1446  NA 138  K 3.8  CL 107  CO2 24  GLUCOSE 92  BUN 9  CREATININE 0.91  CALCIUM 8.5  PROT 6.5  ALBUMIN 3.6  AST 13  ALT 13  ALKPHOS 34*  BILITOT 0.3   Iron/TIBC/Ferritin/ %Sat    Component Value Date/Time   IRON 22 (L) 05/05/2020 1545   IRON 12 (L) 04/18/2016 1034   TIBC 530 (H) 05/05/2020 1545   TIBC 401 04/18/2016 1034   FERRITIN 5 (L) 05/05/2020 1545   FERRITIN 6 (L) 04/18/2016 1034   IRONPCTSAT 4 (L) 05/05/2020 1545     RADIOGRAPHIC STUDIES: I have personally reviewed the radiological images as listed and agreed with the findings in the report. No results found.     ASSESSMENT & PLAN:  1. Other iron deficiency anemia    Labs are reviewed and discussed with patient. Consistent with iron deficiency anemia. Discussed about option of continue oral iron supplementation and IV iron treatments. Discussed about side effects of IV Venofer.  Allergy reactions/infusion reaction including anaphylactic reaction discussed with patient. Other side effects include but not limited to high blood pressure, skin rash, weight gain, leg swelling, etc. Patient voices understanding and she wishes to try oral iron supplementation Currently she is over-the-counter on iron fumarate 18 milligrams daily. Recommend patient to switch to Vitron C 1 tab daily.  Follow-up in 4 weeks for assessment of treatment response.  Junel Fe is listed on her med list. Contains small amount of iron supplementation.    Orders Placed This Encounter  Procedures  . CBC with Differential/Platelet    Standing Status:   Future    Standing Expiration Date:   05/24/2021  . Ferritin    Standing Status:   Future    Standing Expiration Date:   05/24/2021  . Iron and TIBC    Standing Status:   Future    Standing Expiration Date:   05/24/2021  . Retic Panel    Standing Status:   Future    Standing Expiration Date:   05/24/2021    All questions were answered. The patient knows to call the clinic with any problems questions or concerns.  Cc Sherlene Shams, MD   Rickard Patience, MD, PhD Hematology Oncology [facility] 05/24/2020

## 2020-06-04 ENCOUNTER — Other Ambulatory Visit: Payer: Self-pay | Admitting: Internal Medicine

## 2020-06-06 DIAGNOSIS — F4010 Social phobia, unspecified: Secondary | ICD-10-CM | POA: Diagnosis not present

## 2020-06-06 DIAGNOSIS — F331 Major depressive disorder, recurrent, moderate: Secondary | ICD-10-CM | POA: Diagnosis not present

## 2020-06-06 DIAGNOSIS — F900 Attention-deficit hyperactivity disorder, predominantly inattentive type: Secondary | ICD-10-CM | POA: Diagnosis not present

## 2020-06-19 ENCOUNTER — Inpatient Hospital Stay: Payer: BC Managed Care – PPO | Attending: Oncology

## 2020-06-19 DIAGNOSIS — R0602 Shortness of breath: Secondary | ICD-10-CM | POA: Insufficient documentation

## 2020-06-19 DIAGNOSIS — R5383 Other fatigue: Secondary | ICD-10-CM | POA: Diagnosis not present

## 2020-06-19 DIAGNOSIS — I10 Essential (primary) hypertension: Secondary | ICD-10-CM | POA: Insufficient documentation

## 2020-06-19 DIAGNOSIS — D508 Other iron deficiency anemias: Secondary | ICD-10-CM

## 2020-06-19 DIAGNOSIS — Z79899 Other long term (current) drug therapy: Secondary | ICD-10-CM | POA: Diagnosis not present

## 2020-06-19 LAB — CBC WITH DIFFERENTIAL/PLATELET
Abs Immature Granulocytes: 0.01 10*3/uL (ref 0.00–0.07)
Basophils Absolute: 0 10*3/uL (ref 0.0–0.1)
Basophils Relative: 0 %
Eosinophils Absolute: 0.1 10*3/uL (ref 0.0–0.5)
Eosinophils Relative: 1 %
HCT: 40.8 % (ref 36.0–46.0)
Hemoglobin: 12.8 g/dL (ref 12.0–15.0)
Immature Granulocytes: 0 %
Lymphocytes Relative: 26 %
Lymphs Abs: 2.2 10*3/uL (ref 0.7–4.0)
MCH: 23.4 pg — ABNORMAL LOW (ref 26.0–34.0)
MCHC: 31.4 g/dL (ref 30.0–36.0)
MCV: 74.5 fL — ABNORMAL LOW (ref 80.0–100.0)
Monocytes Absolute: 0.7 10*3/uL (ref 0.1–1.0)
Monocytes Relative: 8 %
Neutro Abs: 5.4 10*3/uL (ref 1.7–7.7)
Neutrophils Relative %: 65 %
Platelets: 253 10*3/uL (ref 150–400)
RBC: 5.48 MIL/uL — ABNORMAL HIGH (ref 3.87–5.11)
RDW: 19 % — ABNORMAL HIGH (ref 11.5–15.5)
WBC: 8.4 10*3/uL (ref 4.0–10.5)
nRBC: 0 % (ref 0.0–0.2)

## 2020-06-19 LAB — RETIC PANEL
Immature Retic Fract: 9.3 % (ref 2.3–15.9)
RBC.: 5.4 MIL/uL — ABNORMAL HIGH (ref 3.87–5.11)
Retic Count, Absolute: 62.6 10*3/uL (ref 19.0–186.0)
Retic Ct Pct: 1.2 % (ref 0.4–3.1)
Reticulocyte Hemoglobin: 26.3 pg — ABNORMAL LOW (ref >27.9)

## 2020-06-19 LAB — IRON AND TIBC
Iron: 30 ug/dL (ref 28–170)
Saturation Ratios: 6 % — ABNORMAL LOW (ref 10.4–31.8)
TIBC: 546 ug/dL — ABNORMAL HIGH (ref 250–450)
UIBC: 516 ug/dL

## 2020-06-19 LAB — FERRITIN: Ferritin: 8 ng/mL — ABNORMAL LOW (ref 11–307)

## 2020-06-20 ENCOUNTER — Encounter: Payer: Self-pay | Admitting: Oncology

## 2020-06-20 ENCOUNTER — Inpatient Hospital Stay (HOSPITAL_BASED_OUTPATIENT_CLINIC_OR_DEPARTMENT_OTHER): Payer: BC Managed Care – PPO | Admitting: Oncology

## 2020-06-20 VITALS — BP 152/78 | HR 83 | Temp 99.3°F | Resp 18 | Wt 287.1 lb

## 2020-06-20 DIAGNOSIS — I10 Essential (primary) hypertension: Secondary | ICD-10-CM | POA: Diagnosis not present

## 2020-06-20 DIAGNOSIS — R0602 Shortness of breath: Secondary | ICD-10-CM | POA: Diagnosis not present

## 2020-06-20 DIAGNOSIS — D508 Other iron deficiency anemias: Secondary | ICD-10-CM

## 2020-06-20 DIAGNOSIS — R5383 Other fatigue: Secondary | ICD-10-CM | POA: Diagnosis not present

## 2020-06-20 DIAGNOSIS — Z79899 Other long term (current) drug therapy: Secondary | ICD-10-CM | POA: Diagnosis not present

## 2020-06-20 NOTE — Progress Notes (Signed)
Hematology/Oncology Consult note [facility] [phone] [phone]   Patient Care Team: Sherlene Shams, MD as PCP - General (Internal Medicine)  REFERRING PROVIDER: Sherlene Shams, MD CHIEF COMPLAINTS/REASON FOR VISIT:  Evaluation of iron deficiency anemia  HISTORY OF PRESENTING ILLNESS:  Julie Hammond is a  30 y.o.  female with PMH listed below was seen in consultation at the request of Sherlene Shams, MD   for evaluation of iron deficiency anemia.   Reviewed patient's recent labs  05/05/2020 labs revealed anemia with hemoglobin of 11.3, ferritin 5, iron saturation 4, TIBC 530.Marland Kitchen   Reviewed patient's previous labs ordered by primary care physician's office, anemia is chronic onset , duration is since 2017.  Chronic iron deficiency No aggravating or improving factors.  Associated signs and symptoms: Patient reports fatigue.  SOB with exertion.  Denies weight loss, easy bruising, hematochezia, hemoptysis, hematuria. Context:  History of iron deficiency: Chronic iron deficiency.  Patient currently takes over-the-counter iron fumarate 18 mg daily. Rectal bleeding: Denies Menstrual bleeding/ Vaginal bleeding : She reports that she used to have menorrhagia.  After started on OCP,.  Now her period last 4-5 days, normal flow. Hematemesis or hemoptysis : denie Blood in urine : denies   INTERVAL HISTORY Julie Roys Neisler is a 30 y.o. female who has above history reviewed by me today presents for follow up visit for management of iron deficiency anemia. Problems and complaints are listed below: Patient has been taking oral iron supplementation.  Tolerates well.  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  Fatigue is slightly better.  Review of Systems  Constitutional: Positive for fatigue. Negative for appetite change, chills and fever.  HENT:   Negative for hearing loss and voice change.   Eyes:  Negative for eye problems.  Respiratory: Negative for chest tightness and cough.   Cardiovascular: Negative for chest pain.  Gastrointestinal: Negative for abdominal distention, abdominal pain and blood in stool.  Endocrine: Negative for hot flashes.  Genitourinary: Negative for difficulty urinating and frequency.   Musculoskeletal: Negative for arthralgias.  Skin: Negative for itching and rash.  Neurological: Negative for extremity weakness.  Hematological: Negative for adenopathy.  Psychiatric/Behavioral: Negative for confusion.    MEDICAL HISTORY:  Past Medical History:  Diagnosis Date  . Allergy   . Anxiety   . Chicken pox   . Depression   . Hypertension   . POTS (postural orthostatic tachycardia syndrome)   . Syncope and collapse     SURGICAL HISTORY: Past Surgical History:  Procedure Laterality Date  . NO PAST SURGERIES      SOCIAL HISTORY: Social History   Socioeconomic History  . Marital status: Significant Other    Spouse name: Not on file  . Number of children: Not on file  . Years of education: Not on file  . Highest education level: Not on file  Occupational History  . Not on file  Tobacco Use  . Smoking status: Never Smoker  . Smokeless tobacco: Never Used  Vaping Use  . Vaping Use: Never used  Substance and Sexual Activity  . Alcohol use: Yes    Comment: occasional   . Drug use: No  . Sexual activity: Yes    Birth control/protection: Pill, Condom  Other Topics Concern  . 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 Obesity  Mother   . Obesity Father   . Breast cancer Neg Hx   . Ovarian cancer Neg Hx   . Colon cancer Neg Hx   . Heart disease Neg Hx     ALLERGIES:  has No Known Allergies.  MEDICATIONS:  Current Outpatient Medications  Medication Sig Dispense Refill  . atenolol (TENORMIN) 50 MG tablet TAKE 1 TABLET BY MOUTH TWICE A DAY 180 tablet 1  . buPROPion (WELLBUTRIN XL) 300 MG 24 hr tablet Take 300 mg by mouth daily.    . JUNEL FE 1.5/30 1.5-30 MG-MCG tablet TAKE 1 TABLET BY MOUTH EVERY DAY 84 tablet 3  . sertraline (ZOLOFT) 100 MG tablet Take by mouth.    . Vitamin D, Ergocalciferol, (DRISDOL) 1.25 MG (50000 UNIT) CAPS capsule TAKE 1 CAPSULE BY MOUTH ONE TIME PER WEEK 12 capsule 0   No current facility-administered medications for this visit.     PHYSICAL EXAMINATION: ECOG PERFORMANCE STATUS: 1 - Symptomatic but completely ambulatory Vitals:   06/20/20 1409  BP: (!) 152/78  Pulse: 83  Resp: 18  Temp: 99.3 F (37.4 C)   Filed Weights   06/20/20 1409  Weight: 287 lb 1.6 oz (130.2 kg)    Physical Exam Constitutional:      General: She is not in acute distress. HENT:     Head: Normocephalic and atraumatic.  Eyes:     General: No scleral icterus. Cardiovascular:     Rate and Rhythm: Normal rate and regular rhythm.     Heart sounds: Normal heart sounds.  Pulmonary:     Effort: Pulmonary effort is normal. No respiratory distress.     Breath sounds: No wheezing.  Abdominal:     General: Bowel sounds are normal. There is no distension.     Palpations: Abdomen is soft.  Musculoskeletal:        General: No deformity. Normal range of motion.     Cervical back: Normal range of motion and neck supple.  Skin:    General: Skin is warm and dry.     Findings: No erythema or rash.  Neurological:     Mental Status: She is alert and oriented to person, place, and time. Mental status is at baseline.     Cranial Nerves: No cranial nerve deficit.     Coordination: Coordination normal.   Psychiatric:        Mood and Affect: Mood normal.       CMP Latest Ref Rng & Units 01/24/2020  Glucose 70 - 99 mg/dL 92  BUN 6 - 23 mg/dL 9  Creatinine 6.71 - 2.45 mg/dL 8.09  Sodium 983 - 382 mEq/L 138  Potassium 3.5 - 5.1 mEq/L 3.8  Chloride 96 - 112 mEq/L 107  CO2 19 - 32 mEq/L 24  Calcium 8.4 - 10.5 mg/dL 8.5  Total Protein 6.0 - 8.3 g/dL 6.5  Total Bilirubin 0.2 - 1.2 mg/dL 0.3  Alkaline Phos 39 - 117 U/L 34(L)  AST 0 - 37 U/L 13  ALT 0 - 35 U/L 13   CBC Latest Ref Rng & Units 06/19/2020  WBC 4.0 - 10.5 K/uL 8.4  Hemoglobin 12.0 - 15.0 g/dL 19.3  Hematocrit 79.0 - 46.0 % 40.8  Platelets 150 - 400 K/uL 253     LABORATORY DATA:  I have reviewed the data as listed Lab Results  Component Value Date   WBC 8.4 06/19/2020   HGB 12.8 06/19/2020   HCT 40.8 06/19/2020   MCV 74.5 (L) 06/19/2020   PLT 253 06/19/2020   Recent Labs    01/24/20 1446  NA 138  K 3.8  CL 107  CO2 24  GLUCOSE 92  BUN 9  CREATININE 0.91  CALCIUM 8.5  PROT 6.5  ALBUMIN 3.6  AST 13  ALT 13  ALKPHOS 34*  BILITOT 0.3   Iron/TIBC/Ferritin/ %Sat    Component Value Date/Time   IRON 30 06/19/2020 1523   IRON 12 (L) 04/18/2016 1034   TIBC 546 (H) 06/19/2020 1523   TIBC 401 04/18/2016 1034   FERRITIN 8 (L) 06/19/2020 1523   FERRITIN 6 (L) 04/18/2016 1034   IRONPCTSAT 6 (L) 06/19/2020 1523   IRONPCTSAT 4 (L) 05/05/2020 1545     RADIOGRAPHIC STUDIES: I have personally reviewed the radiological images as listed and agreed with the findings in the report. No results found.     ASSESSMENT & PLAN:  1. Other iron deficiency anemia   Labs reviewed and discussed with patient. Hemoglobin has normalized to 12.8. Ferritin is still low at 8, iron saturation 6, Discussed with patient about option of continue oral iron supplementation or proceed with IV Venofer treatments Patient prefers to continue Vitron C1 tablet daily.  Follow-up in 3 months.  For assessment of treatment  response.   Orders Placed This Encounter  Procedures  . CBC with Differential/Platelet    Standing Status:   Future    Standing Expiration Date:   06/20/2021  . Ferritin    Standing Status:   Future    Standing Expiration Date:   06/20/2021  . Iron and TIBC    Standing Status:   Future    Standing Expiration Date:   06/20/2021    All questions were answered. The patient knows to call the clinic with any problems questions or concerns.  Cc Sherlene Shams, MD   Rickard Patience, MD, PhD Hematology Oncology [facility] 06/20/2020

## 2020-06-20 NOTE — Progress Notes (Signed)
Patient here for follow up. No new concerns voiced.  °

## 2020-06-21 DIAGNOSIS — F4010 Social phobia, unspecified: Secondary | ICD-10-CM | POA: Diagnosis not present

## 2020-06-21 DIAGNOSIS — F900 Attention-deficit hyperactivity disorder, predominantly inattentive type: Secondary | ICD-10-CM | POA: Diagnosis not present

## 2020-06-21 DIAGNOSIS — F331 Major depressive disorder, recurrent, moderate: Secondary | ICD-10-CM | POA: Diagnosis not present

## 2020-07-04 DIAGNOSIS — F331 Major depressive disorder, recurrent, moderate: Secondary | ICD-10-CM | POA: Diagnosis not present

## 2020-07-04 DIAGNOSIS — F900 Attention-deficit hyperactivity disorder, predominantly inattentive type: Secondary | ICD-10-CM | POA: Diagnosis not present

## 2020-07-04 DIAGNOSIS — F4010 Social phobia, unspecified: Secondary | ICD-10-CM | POA: Diagnosis not present

## 2020-07-09 ENCOUNTER — Other Ambulatory Visit: Payer: Self-pay | Admitting: Internal Medicine

## 2020-07-18 DIAGNOSIS — F331 Major depressive disorder, recurrent, moderate: Secondary | ICD-10-CM | POA: Diagnosis not present

## 2020-07-18 DIAGNOSIS — F900 Attention-deficit hyperactivity disorder, predominantly inattentive type: Secondary | ICD-10-CM | POA: Diagnosis not present

## 2020-07-18 DIAGNOSIS — F4010 Social phobia, unspecified: Secondary | ICD-10-CM | POA: Diagnosis not present

## 2020-07-29 DIAGNOSIS — F4011 Social phobia, generalized: Secondary | ICD-10-CM | POA: Diagnosis not present

## 2020-07-29 DIAGNOSIS — F331 Major depressive disorder, recurrent, moderate: Secondary | ICD-10-CM | POA: Diagnosis not present

## 2020-07-29 DIAGNOSIS — F5105 Insomnia due to other mental disorder: Secondary | ICD-10-CM | POA: Diagnosis not present

## 2020-08-01 DIAGNOSIS — F4010 Social phobia, unspecified: Secondary | ICD-10-CM | POA: Diagnosis not present

## 2020-08-01 DIAGNOSIS — F900 Attention-deficit hyperactivity disorder, predominantly inattentive type: Secondary | ICD-10-CM | POA: Diagnosis not present

## 2020-08-01 DIAGNOSIS — F331 Major depressive disorder, recurrent, moderate: Secondary | ICD-10-CM | POA: Diagnosis not present

## 2020-08-15 DIAGNOSIS — F331 Major depressive disorder, recurrent, moderate: Secondary | ICD-10-CM | POA: Diagnosis not present

## 2020-08-15 DIAGNOSIS — F4010 Social phobia, unspecified: Secondary | ICD-10-CM | POA: Diagnosis not present

## 2020-08-15 DIAGNOSIS — F900 Attention-deficit hyperactivity disorder, predominantly inattentive type: Secondary | ICD-10-CM | POA: Diagnosis not present

## 2020-08-29 DIAGNOSIS — F900 Attention-deficit hyperactivity disorder, predominantly inattentive type: Secondary | ICD-10-CM | POA: Diagnosis not present

## 2020-08-29 DIAGNOSIS — F331 Major depressive disorder, recurrent, moderate: Secondary | ICD-10-CM | POA: Diagnosis not present

## 2020-08-29 DIAGNOSIS — F4010 Social phobia, unspecified: Secondary | ICD-10-CM | POA: Diagnosis not present

## 2020-09-12 ENCOUNTER — Telehealth: Payer: Self-pay | Admitting: Oncology

## 2020-09-12 NOTE — Telephone Encounter (Signed)
Patient called to cancel all upcoming appointments. She stated she is out of town and needs to call back when she can come back in.

## 2020-09-13 DIAGNOSIS — F900 Attention-deficit hyperactivity disorder, predominantly inattentive type: Secondary | ICD-10-CM | POA: Diagnosis not present

## 2020-09-13 DIAGNOSIS — F331 Major depressive disorder, recurrent, moderate: Secondary | ICD-10-CM | POA: Diagnosis not present

## 2020-09-13 DIAGNOSIS — F4010 Social phobia, unspecified: Secondary | ICD-10-CM | POA: Diagnosis not present

## 2020-09-18 ENCOUNTER — Other Ambulatory Visit: Payer: BC Managed Care – PPO

## 2020-09-20 ENCOUNTER — Ambulatory Visit: Payer: BC Managed Care – PPO | Admitting: Oncology

## 2020-09-26 DIAGNOSIS — F4010 Social phobia, unspecified: Secondary | ICD-10-CM | POA: Diagnosis not present

## 2020-09-26 DIAGNOSIS — F900 Attention-deficit hyperactivity disorder, predominantly inattentive type: Secondary | ICD-10-CM | POA: Diagnosis not present

## 2020-09-26 DIAGNOSIS — F331 Major depressive disorder, recurrent, moderate: Secondary | ICD-10-CM | POA: Diagnosis not present

## 2020-10-03 ENCOUNTER — Other Ambulatory Visit: Payer: Self-pay | Admitting: Internal Medicine

## 2020-10-06 ENCOUNTER — Other Ambulatory Visit: Payer: Self-pay

## 2020-10-06 ENCOUNTER — Other Ambulatory Visit (HOSPITAL_COMMUNITY)
Admission: RE | Admit: 2020-10-06 | Discharge: 2020-10-06 | Disposition: A | Discharge status: Home / Self Care | Payer: BC Managed Care – PPO | Source: Ambulatory Visit | Attending: Internal Medicine | Admitting: Internal Medicine

## 2020-10-06 ENCOUNTER — Encounter: Payer: Self-pay | Admitting: Internal Medicine

## 2020-10-06 ENCOUNTER — Ambulatory Visit: Payer: BC Managed Care – PPO | Admitting: Internal Medicine

## 2020-10-06 VITALS — BP 126/98 | HR 125 | Temp 95.8°F | Resp 16 | Ht 63.0 in | Wt 293.2 lb

## 2020-10-06 DIAGNOSIS — Z124 Encounter for screening for malignant neoplasm of cervix: Secondary | ICD-10-CM | POA: Insufficient documentation

## 2020-10-06 DIAGNOSIS — D509 Iron deficiency anemia, unspecified: Secondary | ICD-10-CM

## 2020-10-06 DIAGNOSIS — U071 COVID-19: Secondary | ICD-10-CM | POA: Insufficient documentation

## 2020-10-06 DIAGNOSIS — Z113 Encounter for screening for infections with a predominantly sexual mode of transmission: Secondary | ICD-10-CM | POA: Insufficient documentation

## 2020-10-06 NOTE — Assessment & Plan Note (Signed)
She is taking iron supplements and is due for repeat testing

## 2020-10-06 NOTE — Progress Notes (Signed)
Subjective:  Patient ID: Julie Hammond, female    DOB: 09-Oct-1990  Age: 30 y.o. MRN: [id number]  CC: The encounter diagnosis was Cervical cancer screening.  HPI Ceci Zayas presents for cervical cancer screening  This visit occurred during the SARS-CoV-2 public health emergency.  Safety protocols were in place, including screening questions prior to the visit, additional usage of staff PPE, and extensive cleaning of exam room while observing appropriate contact time as indicated for disinfecting solutions.   Rhyann had a COVID Infection in January and continues to suffer from fatigue.  She denies dyspnea , cough and anosmia.  She has not returned to regular exercise.  She continues to take an iron supplement but defers blood tests today as she has not eaten all day   Outpatient Medications Prior to Visit  Medication Sig Dispense Refill   atenolol (TENORMIN) 50 MG tablet TAKE 1 TABLET BY MOUTH TWICE A DAY 180 tablet 1   buPROPion (WELLBUTRIN XL) 300 MG 24 hr tablet Take 300 mg by mouth daily.     JUNEL FE 1.5/30 1.5-30 MG-MCG tablet TAKE 1 TABLET BY MOUTH EVERY DAY 84 tablet 3   sertraline (ZOLOFT) 100 MG tablet Take by mouth.     Vitamin D, Ergocalciferol, (DRISDOL) 1.25 MG (50000 UNIT) CAPS capsule TAKE 1 CAPSULE BY MOUTH ONE TIME PER WEEK 12 capsule 0   No facility-administered medications prior to visit.    Review of Systems;  Patient denies headache, fevers, malaise, unintentional weight loss, skin rash, eye pain, sinus congestion and sinus pain, sore throat, dysphagia,  hemoptysis , cough, dyspnea, wheezing, chest pain, palpitations, orthopnea, edema, abdominal pain, nausea, melena, diarrhea, constipation, flank pain, dysuria, hematuria, urinary  Frequency, nocturia, numbness, tingling, seizures,  Focal weakness, Loss of consciousness,  Tremor, insomnia, depression, anxiety, and suicidal ideation.      Objective:  BP (!) 126/98 (BP Location: Left Arm, Patient  Position: Sitting, Cuff Size: Large)   Pulse (!) 125   Temp (!) 95.8 F (35.4 C) (Temporal)   Resp 16   Ht 5\' 3"  (1.6 m)   Wt 293 lb 3.2 oz (133 kg)   SpO2 98%   BMI 51.94 kg/m   BP Readings from Last 3 Encounters:  10/06/20 (!) 126/98  06/20/20 (!) 152/78  05/24/20 (!) 158/95    Wt Readings from Last 3 Encounters:  10/06/20 293 lb 3.2 oz (133 kg)  06/20/20 287 lb 1.6 oz (130.2 kg)  05/24/20 284 lb 4.8 oz (129 kg)    General Appearance:    Alert, cooperative, no distress, appears stated age  Head:    Normocephalic, without obvious abnormality, atraumatic           Throat:   Lips, mucosa, and tongue normal; teeth and gums normal  Neck:   Supple, symmetrical, trachea midline, no adenopathy;    thyroid:  no enlargement/tenderness/nodules; no carotid   bruit or JVD  Back:     Symmetric, no curvature, ROM normal, no CVA tenderness  Lungs:     Clear to auscultation bilaterally, respirations unlabored  Chest Wall:    No tenderness or deformity   Heart:    Regular rate and rhythm, S1 and S2 normal, no murmur, rub   or gallop     Abdomen:     Soft, non-tender, bowel sounds active all four quadrants,    no masses, no organomegaly  Genitalia:    Pelvic: cervix normal in appearance, external genitalia normal, no adnexal masses or tenderness, no cervical  motion tenderness, rectovaginal septum normal, uterus normal size, shape, and consistency and vagina normal without discharge  Extremities:   Extremities normal, atraumatic, no cyanosis or edema  Pulses:   2+ and symmetric all extremities  Skin:   Skin color, texture, turgor normal, no rashes or lesions  Lymph nodes:   Cervical, supraclavicular, and axillary nodes normal  Neurologic:   CNII-XII intact, normal strength, sensation and reflexes    throughout    Lab Results  Component Value Date   HGBA1C 5.1 01/24/2020   HGBA1C 5.3 04/08/2018   HGBA1C 5.2 12/11/2015    Lab Results  Component Value Date   CREATININE 0.91  01/24/2020   CREATININE 0.69 04/08/2018   CREATININE 0.73 10/06/2017    Lab Results  Component Value Date   WBC 8.4 06/19/2020   HGB 12.8 06/19/2020   HCT 40.8 06/19/2020   PLT 253 06/19/2020   GLUCOSE 92 01/24/2020   CHOL 153 01/24/2020   TRIG 129.0 01/24/2020   HDL 44.10 01/24/2020   LDLDIRECT 100.0 01/24/2020   LDLCALC 83 01/24/2020   ALT 13 01/24/2020   AST 13 01/24/2020   NA 138 01/24/2020   K 3.8 01/24/2020   CL 107 01/24/2020   CREATININE 0.91 01/24/2020   BUN 9 01/24/2020   CO2 24 01/24/2020   TSH 1.87 01/24/2020   HGBA1C 5.1 01/24/2020    MR Brain W Wo Contrast  Result Date: 02/09/2020 CLINICAL DATA:  Gradual loss of taste and smell EXAM: MRI HEAD WITHOUT AND WITH CONTRAST TECHNIQUE: Multiplanar, multiecho pulse sequences of the brain and surrounding structures were obtained without and with intravenous contrast. CONTRAST:  15mL GADAVIST GADOBUTROL 1 MMOL/ML IV SOLN COMPARISON:  None. FINDINGS: Brain: No mass or gliosis in the anterior cranial fossa. No white matter disease, infarct, hydrocephalus, or collection. Brain volume is normal. On dedicated slices there are symmetric olfactory bulbs. Vascular: Normal flow voids. Skull and upper cervical spine: Normal marrow signal. Sinuses/Orbits: Small retention cyst in the left maxillary sinus. Rightward septal deviation contacting the middle turbinate. No mass seen in the nasal cavity. IMPRESSION: 1. Normal MRI of the brain.  No explanation for symptoms. 2. Rightward nasal septal deviation. Electronically Signed   By: Marnee Spring M.D.   On: 02/09/2020 05:35    Assessment & Plan:   Problem List Items Addressed This Visit       Unprioritized   Cervical cancer screening - Primary    PAP smear with STD screening done today  Pelvic exam was normal       Relevant Orders   Cytology - PAP    I am having Latoyia Wicklund maintain her Junel FE 1.5/30, buPROPion, sertraline, atenolol, and Vitamin D  (Ergocalciferol).  No orders of the defined types were placed in this encounter.   There are no discontinued medications.  Follow-up: No follow-ups on file.   Sherlene Shams, MD

## 2020-10-06 NOTE — Patient Instructions (Signed)
Good to see you!   Return at your leisure for an iron check   [facility] has a COVID long hauler clinic,  let me know if you want a referral

## 2020-10-06 NOTE — Assessment & Plan Note (Signed)
PAP smear with STD screening done today  Pelvic exam was normal

## 2020-10-06 NOTE — Assessment & Plan Note (Signed)
Weight gain noted due to lack of exercise since recovering fro m CoVID

## 2020-10-10 DIAGNOSIS — F4010 Social phobia, unspecified: Secondary | ICD-10-CM | POA: Diagnosis not present

## 2020-10-10 DIAGNOSIS — F331 Major depressive disorder, recurrent, moderate: Secondary | ICD-10-CM | POA: Diagnosis not present

## 2020-10-10 DIAGNOSIS — F900 Attention-deficit hyperactivity disorder, predominantly inattentive type: Secondary | ICD-10-CM | POA: Diagnosis not present

## 2020-10-12 LAB — CYTOLOGY - PAP
Chlamydia: NEGATIVE
Comment: NEGATIVE
Comment: NEGATIVE
Comment: NEGATIVE
Comment: NEGATIVE
Comment: NORMAL
Diagnosis: NEGATIVE
Diagnosis: REACTIVE
HSV1: NEGATIVE
HSV2: NEGATIVE
High risk HPV: NEGATIVE
Neisseria Gonorrhea: NEGATIVE
Trichomonas: NEGATIVE

## 2020-10-18 DIAGNOSIS — F5105 Insomnia due to other mental disorder: Secondary | ICD-10-CM | POA: Diagnosis not present

## 2020-10-18 DIAGNOSIS — F4011 Social phobia, generalized: Secondary | ICD-10-CM | POA: Diagnosis not present

## 2020-10-18 DIAGNOSIS — F331 Major depressive disorder, recurrent, moderate: Secondary | ICD-10-CM | POA: Diagnosis not present

## 2020-10-25 DIAGNOSIS — F900 Attention-deficit hyperactivity disorder, predominantly inattentive type: Secondary | ICD-10-CM | POA: Diagnosis not present

## 2020-10-25 DIAGNOSIS — F331 Major depressive disorder, recurrent, moderate: Secondary | ICD-10-CM | POA: Diagnosis not present

## 2020-10-25 DIAGNOSIS — F4010 Social phobia, unspecified: Secondary | ICD-10-CM | POA: Diagnosis not present

## 2020-11-08 DIAGNOSIS — F900 Attention-deficit hyperactivity disorder, predominantly inattentive type: Secondary | ICD-10-CM | POA: Diagnosis not present

## 2020-11-08 DIAGNOSIS — F331 Major depressive disorder, recurrent, moderate: Secondary | ICD-10-CM | POA: Diagnosis not present

## 2020-11-08 DIAGNOSIS — F4010 Social phobia, unspecified: Secondary | ICD-10-CM | POA: Diagnosis not present

## 2020-11-22 DIAGNOSIS — F4010 Social phobia, unspecified: Secondary | ICD-10-CM | POA: Diagnosis not present

## 2020-11-22 DIAGNOSIS — F900 Attention-deficit hyperactivity disorder, predominantly inattentive type: Secondary | ICD-10-CM | POA: Diagnosis not present

## 2020-11-22 DIAGNOSIS — F331 Major depressive disorder, recurrent, moderate: Secondary | ICD-10-CM | POA: Diagnosis not present

## 2020-12-02 ENCOUNTER — Other Ambulatory Visit: Payer: Self-pay | Admitting: Internal Medicine

## 2020-12-06 DIAGNOSIS — F331 Major depressive disorder, recurrent, moderate: Secondary | ICD-10-CM | POA: Diagnosis not present

## 2020-12-06 DIAGNOSIS — F900 Attention-deficit hyperactivity disorder, predominantly inattentive type: Secondary | ICD-10-CM | POA: Diagnosis not present

## 2020-12-06 DIAGNOSIS — F4010 Social phobia, unspecified: Secondary | ICD-10-CM | POA: Diagnosis not present

## 2020-12-11 ENCOUNTER — Other Ambulatory Visit: Payer: Self-pay | Admitting: Internal Medicine

## 2020-12-18 DIAGNOSIS — F331 Major depressive disorder, recurrent, moderate: Secondary | ICD-10-CM | POA: Diagnosis not present

## 2020-12-18 DIAGNOSIS — F4010 Social phobia, unspecified: Secondary | ICD-10-CM | POA: Diagnosis not present

## 2020-12-18 DIAGNOSIS — F900 Attention-deficit hyperactivity disorder, predominantly inattentive type: Secondary | ICD-10-CM | POA: Diagnosis not present

## 2020-12-22 ENCOUNTER — Other Ambulatory Visit: Payer: Self-pay | Admitting: Internal Medicine

## 2020-12-22 IMAGING — MR MR HEAD WO/W CM
11 series · 48 of 48 positions shown · IV contrast (gadavist)
Comparison: None.

CLINICAL DATA: Gradual loss of taste and smell

EXAM:
MRI HEAD WITHOUT AND WITH CONTRAST
TECHNIQUE: Multiplanar, multiecho pulse sequences of the brain and surrounding
structures were obtained without and with intravenous contrast.
CONTRAST:  10mL GADAVIST GADOBUTROL 1 MMOL/ML IV SOLN

[Series 2: T1 · sagittal · 5.0mm · 0.45mm/px · 3 of 25 slices shown (1 of 2)]
[im 1/25]
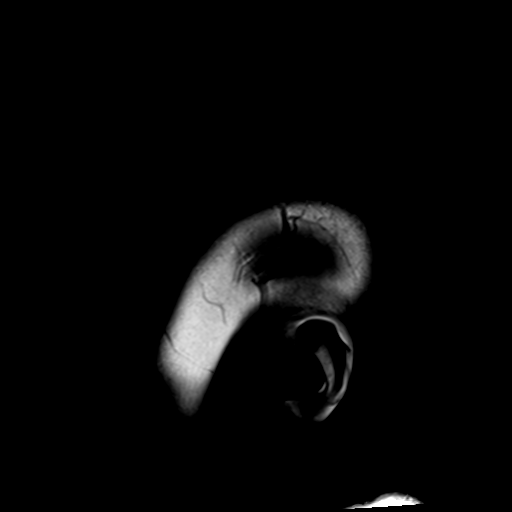
[im 13/25]
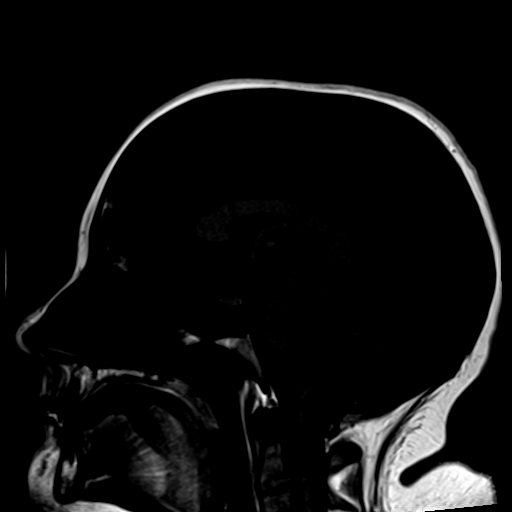
[im 25/25]
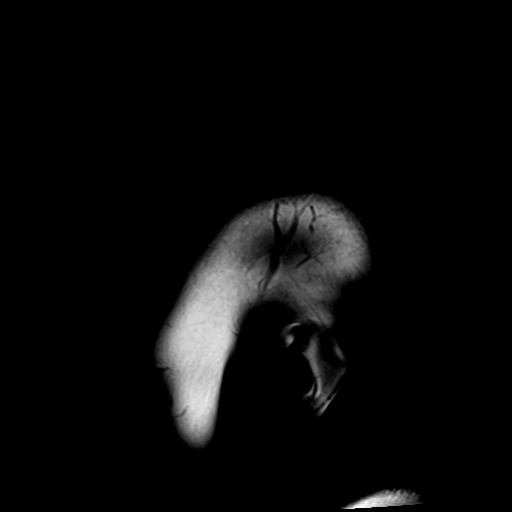

[Series 4: DWI · axial · 3.0mm · 1.20mm/px · z∈[-87,+72]mm · 4 of 55 slices shown (1 of 2)]
[im 1/55]
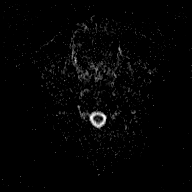
[im 19/55]
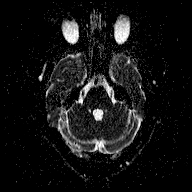
[im 37/55]
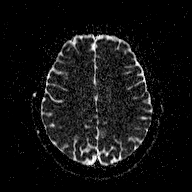
[im 55/55]
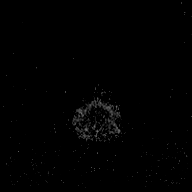

[Series 5: T2 · axial · 5.0mm · 0.72mm/px · z∈[-84,+69]mm · 2 of 25 slices shown (1 of 2)]
[im 1/25]
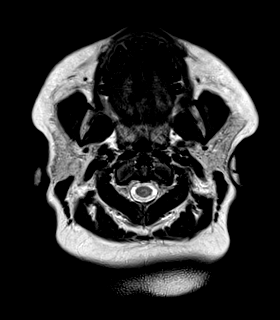
[im 25/25]
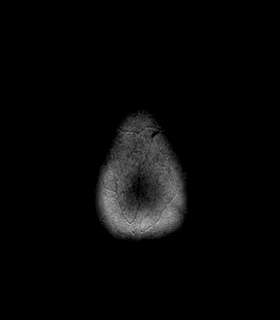

[Series 7: FLAIR · axial · 5.0mm · 0.45mm/px · z∈[-84,+69]mm · 2 of 25 slices shown]
[im 1/25]
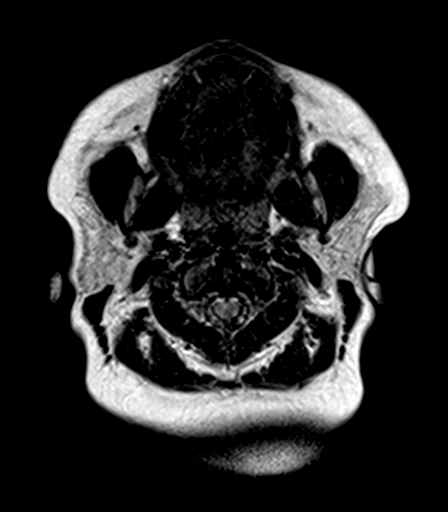
[im 25/25]
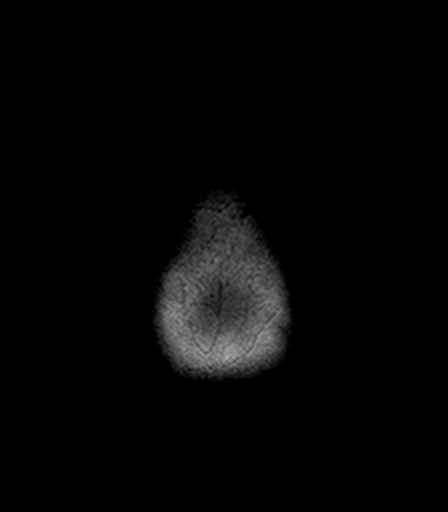

[Series 8: T2 · axial · 5.0mm · 0.72mm/px · z∈[-84,+69]mm · 2 of 25 slices shown (2 of 2)]
[im 1/25]
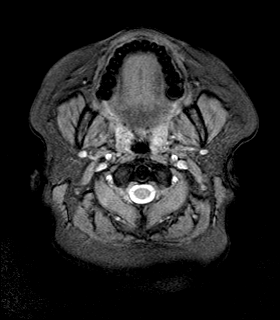
[im 25/25]
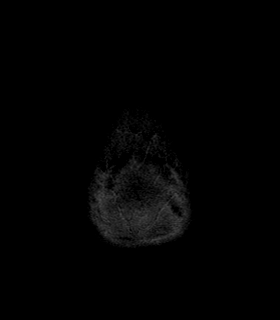

[Series 9: T1 · axial · 1.0mm · 1.00mm/px · z∈[-83,+73]mm · 12 of 160 slices shown (2 of 2)]
[im 1/160]
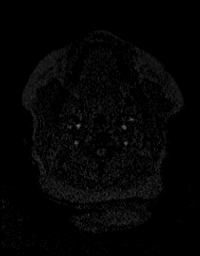
[im 15/160]
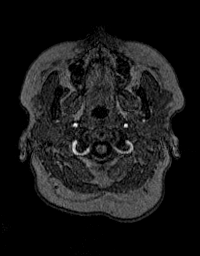
[im 29/160]
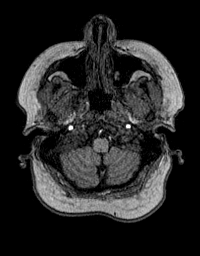
[im 44/160]
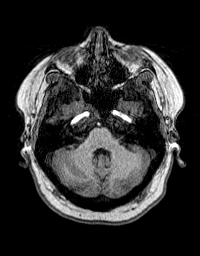
[im 58/160]
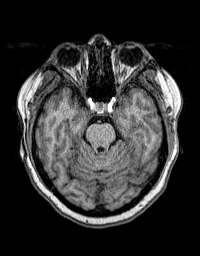
[im 73/160]
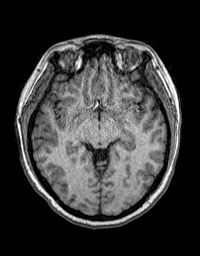
[im 87/160]
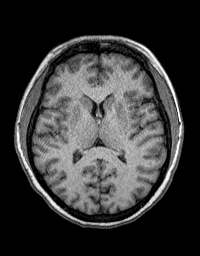
[im 102/160]
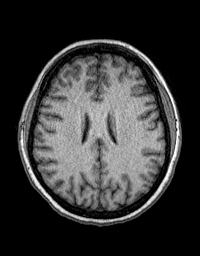
[im 116/160]
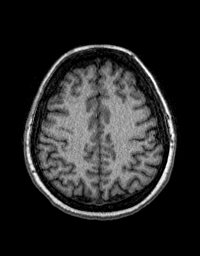
[im 131/160]
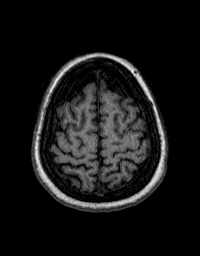
[im 145/160]
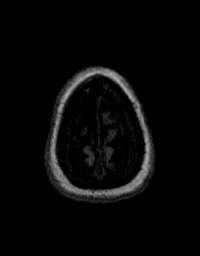
[im 160/160]
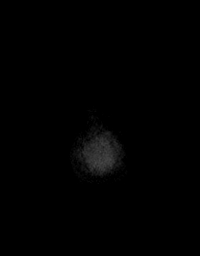

[Series 10: T2 fat-sat · coronal · 3.0mm · 0.45mm/px · 3 of 35 slices shown (1 of 2)]
[im 1/35]
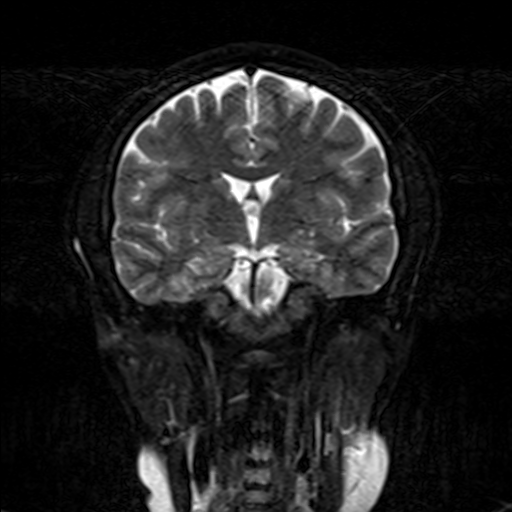
[im 18/35]
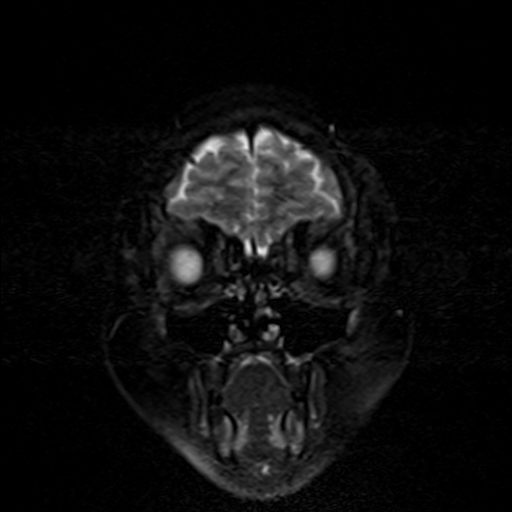
[im 35/35]
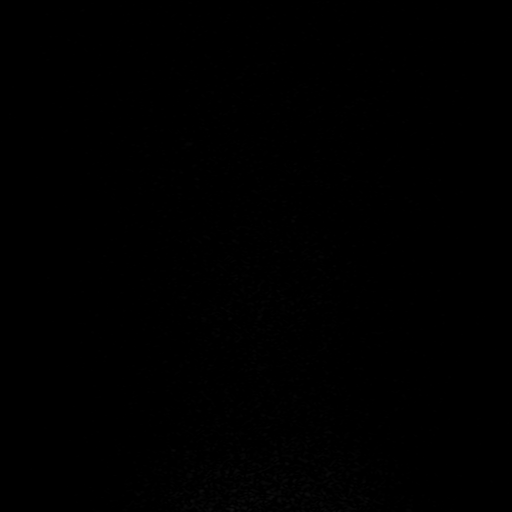

[Series 11: T2 fat-sat · axial · 3.0mm · 0.72mm/px · z∈[-86,+15]mm · 2 of 32 slices shown (2 of 2)]
[im 1/32]
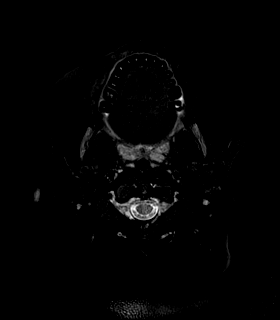
[im 32/32]
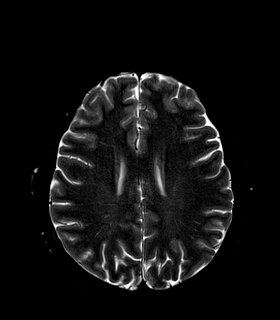

[Series 12: T1 post-contrast · axial · 1.0mm · 1.00mm/px · z∈[-83,+73]mm · 12 of 160 slices shown (1 of 2)]
[im 1/160]
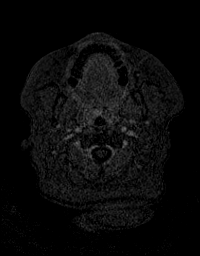
[im 15/160]
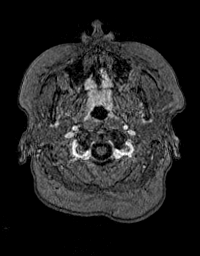
[im 29/160]
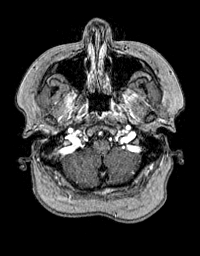
[im 44/160]
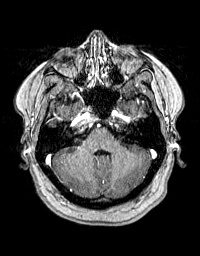
[im 58/160]
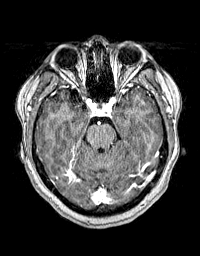
[im 73/160]
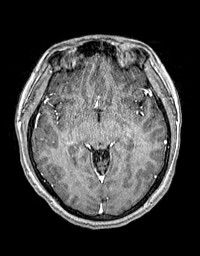
[im 87/160]
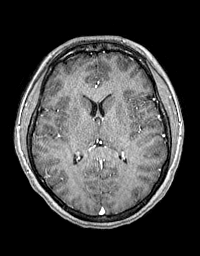
[im 102/160]
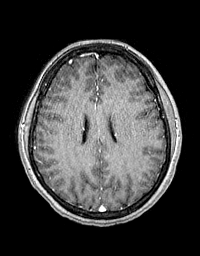
[im 116/160]
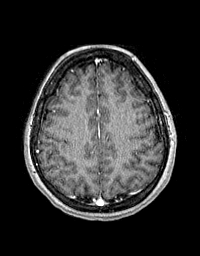
[im 131/160]
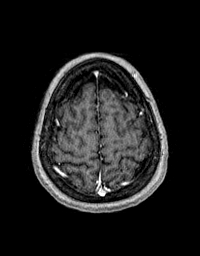
[im 145/160]
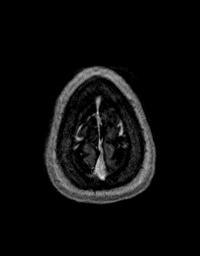
[im 160/160]
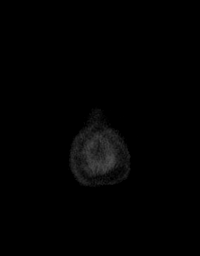

[Series 13: T1 post-contrast · coronal · 5.0mm · 0.43mm/px · 2 of 32 slices shown (2 of 2)]
[im 1/32]
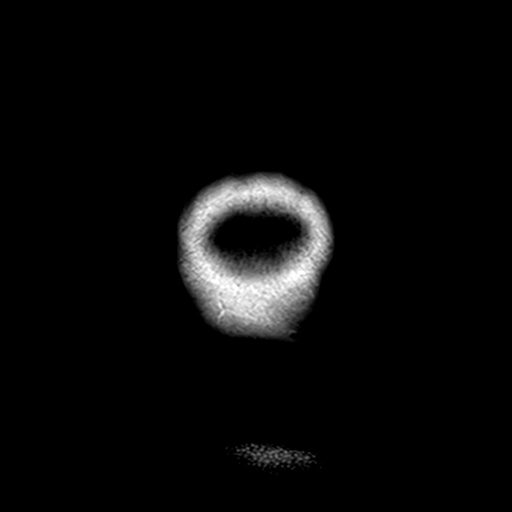
[im 32/32]
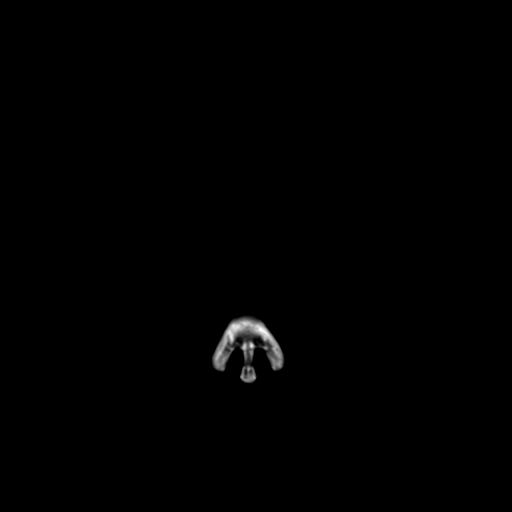

[Series 100: DWI · axial · 3.0mm · 1.20mm/px · z∈[-87,+72]mm · 4 of 55 slices shown (2 of 2)]
[im 1/55]
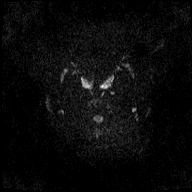
[im 19/55]
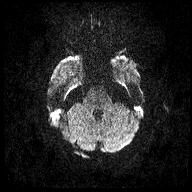
[im 37/55]
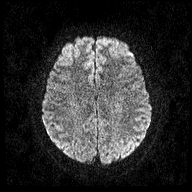
[im 55/55]
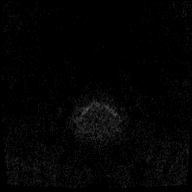

[48 of 48 positions shown; findings below may reference images not displayed]

FINDINGS: Brain: No mass or gliosis in the anterior cranial fossa. No white
matter disease, infarct, hydrocephalus, or collection. Brain volume
is normal. On dedicated slices there are symmetric olfactory bulbs.

Vascular: Normal flow voids.

Skull and upper cervical spine: Normal marrow signal.

Sinuses/Orbits: Small retention cyst in the left maxillary sinus.
Rightward septal deviation contacting the middle turbinate. No mass
seen in the nasal cavity.
IMPRESSION: 1. Normal MRI of the brain.  No explanation for symptoms.
2. Rightward nasal septal deviation.

## 2020-12-25 NOTE — Telephone Encounter (Signed)
Last OV 7/22 last Vit D level 2/22 was 11 okay to fill Vit D?

## 2021-01-01 DIAGNOSIS — F4010 Social phobia, unspecified: Secondary | ICD-10-CM | POA: Diagnosis not present

## 2021-01-01 DIAGNOSIS — F331 Major depressive disorder, recurrent, moderate: Secondary | ICD-10-CM | POA: Diagnosis not present

## 2021-01-01 DIAGNOSIS — F900 Attention-deficit hyperactivity disorder, predominantly inattentive type: Secondary | ICD-10-CM | POA: Diagnosis not present

## 2021-01-17 DIAGNOSIS — F4010 Social phobia, unspecified: Secondary | ICD-10-CM | POA: Diagnosis not present

## 2021-01-17 DIAGNOSIS — F900 Attention-deficit hyperactivity disorder, predominantly inattentive type: Secondary | ICD-10-CM | POA: Diagnosis not present

## 2021-01-17 DIAGNOSIS — F331 Major depressive disorder, recurrent, moderate: Secondary | ICD-10-CM | POA: Diagnosis not present

## 2021-01-23 ENCOUNTER — Ambulatory Visit: Payer: BC Managed Care – PPO | Admitting: Internal Medicine

## 2021-01-23 DIAGNOSIS — F5105 Insomnia due to other mental disorder: Secondary | ICD-10-CM | POA: Diagnosis not present

## 2021-01-23 DIAGNOSIS — F4011 Social phobia, generalized: Secondary | ICD-10-CM | POA: Diagnosis not present

## 2021-01-23 DIAGNOSIS — F331 Major depressive disorder, recurrent, moderate: Secondary | ICD-10-CM | POA: Diagnosis not present

## 2021-01-29 DIAGNOSIS — F4010 Social phobia, unspecified: Secondary | ICD-10-CM | POA: Diagnosis not present

## 2021-01-29 DIAGNOSIS — F331 Major depressive disorder, recurrent, moderate: Secondary | ICD-10-CM | POA: Diagnosis not present

## 2021-01-29 DIAGNOSIS — F900 Attention-deficit hyperactivity disorder, predominantly inattentive type: Secondary | ICD-10-CM | POA: Diagnosis not present

## 2021-01-30 ENCOUNTER — Encounter: Payer: BC Managed Care – PPO | Admitting: Internal Medicine

## 2021-02-12 DIAGNOSIS — F4010 Social phobia, unspecified: Secondary | ICD-10-CM | POA: Diagnosis not present

## 2021-02-12 DIAGNOSIS — F900 Attention-deficit hyperactivity disorder, predominantly inattentive type: Secondary | ICD-10-CM | POA: Diagnosis not present

## 2021-02-12 DIAGNOSIS — F331 Major depressive disorder, recurrent, moderate: Secondary | ICD-10-CM | POA: Diagnosis not present

## 2021-02-26 DIAGNOSIS — F331 Major depressive disorder, recurrent, moderate: Secondary | ICD-10-CM | POA: Diagnosis not present

## 2021-02-26 DIAGNOSIS — F900 Attention-deficit hyperactivity disorder, predominantly inattentive type: Secondary | ICD-10-CM | POA: Diagnosis not present

## 2021-02-26 DIAGNOSIS — F4010 Social phobia, unspecified: Secondary | ICD-10-CM | POA: Diagnosis not present

## 2021-03-14 DIAGNOSIS — F331 Major depressive disorder, recurrent, moderate: Secondary | ICD-10-CM | POA: Diagnosis not present

## 2021-03-14 DIAGNOSIS — F900 Attention-deficit hyperactivity disorder, predominantly inattentive type: Secondary | ICD-10-CM | POA: Diagnosis not present

## 2021-03-14 DIAGNOSIS — F4010 Social phobia, unspecified: Secondary | ICD-10-CM | POA: Diagnosis not present

## 2021-03-18 ENCOUNTER — Other Ambulatory Visit: Payer: Self-pay | Admitting: Internal Medicine

## 2021-03-19 DIAGNOSIS — F331 Major depressive disorder, recurrent, moderate: Secondary | ICD-10-CM | POA: Diagnosis not present

## 2021-03-19 DIAGNOSIS — F4010 Social phobia, unspecified: Secondary | ICD-10-CM | POA: Diagnosis not present

## 2021-03-19 DIAGNOSIS — F900 Attention-deficit hyperactivity disorder, predominantly inattentive type: Secondary | ICD-10-CM | POA: Diagnosis not present

## 2021-04-02 DIAGNOSIS — F331 Major depressive disorder, recurrent, moderate: Secondary | ICD-10-CM | POA: Diagnosis not present

## 2021-04-02 DIAGNOSIS — F4010 Social phobia, unspecified: Secondary | ICD-10-CM | POA: Diagnosis not present

## 2021-04-02 DIAGNOSIS — F900 Attention-deficit hyperactivity disorder, predominantly inattentive type: Secondary | ICD-10-CM | POA: Diagnosis not present

## 2021-04-11 DIAGNOSIS — F4010 Social phobia, unspecified: Secondary | ICD-10-CM | POA: Diagnosis not present

## 2021-04-11 DIAGNOSIS — F900 Attention-deficit hyperactivity disorder, predominantly inattentive type: Secondary | ICD-10-CM | POA: Diagnosis not present

## 2021-04-11 DIAGNOSIS — F331 Major depressive disorder, recurrent, moderate: Secondary | ICD-10-CM | POA: Diagnosis not present

## 2021-05-07 DIAGNOSIS — F900 Attention-deficit hyperactivity disorder, predominantly inattentive type: Secondary | ICD-10-CM | POA: Diagnosis not present

## 2021-05-07 DIAGNOSIS — F331 Major depressive disorder, recurrent, moderate: Secondary | ICD-10-CM | POA: Diagnosis not present

## 2021-05-07 DIAGNOSIS — F4010 Social phobia, unspecified: Secondary | ICD-10-CM | POA: Diagnosis not present

## 2021-05-11 DIAGNOSIS — F331 Major depressive disorder, recurrent, moderate: Secondary | ICD-10-CM | POA: Diagnosis not present

## 2021-05-11 DIAGNOSIS — F5105 Insomnia due to other mental disorder: Secondary | ICD-10-CM | POA: Diagnosis not present

## 2021-05-11 DIAGNOSIS — F4011 Social phobia, generalized: Secondary | ICD-10-CM | POA: Diagnosis not present

## 2021-05-31 ENCOUNTER — Other Ambulatory Visit: Payer: Self-pay | Admitting: Internal Medicine

## 2021-06-18 ENCOUNTER — Other Ambulatory Visit: Payer: Self-pay | Admitting: Internal Medicine

## 2021-09-05 ENCOUNTER — Telehealth: Payer: Self-pay

## 2021-09-05 ENCOUNTER — Other Ambulatory Visit: Payer: Self-pay | Admitting: Internal Medicine

## 2021-09-05 NOTE — Telephone Encounter (Signed)
LMTCB patient not seen since 11/22.

## 2021-10-03 DIAGNOSIS — F900 Attention-deficit hyperactivity disorder, predominantly inattentive type: Secondary | ICD-10-CM | POA: Diagnosis not present

## 2021-10-03 DIAGNOSIS — F4010 Social phobia, unspecified: Secondary | ICD-10-CM | POA: Diagnosis not present

## 2021-10-03 DIAGNOSIS — F4011 Social phobia, generalized: Secondary | ICD-10-CM | POA: Diagnosis not present

## 2021-10-03 DIAGNOSIS — F331 Major depressive disorder, recurrent, moderate: Secondary | ICD-10-CM | POA: Diagnosis not present

## 2021-10-03 DIAGNOSIS — F5105 Insomnia due to other mental disorder: Secondary | ICD-10-CM | POA: Diagnosis not present

## 2021-10-19 DIAGNOSIS — F4010 Social phobia, unspecified: Secondary | ICD-10-CM | POA: Diagnosis not present

## 2021-10-19 DIAGNOSIS — F331 Major depressive disorder, recurrent, moderate: Secondary | ICD-10-CM | POA: Diagnosis not present

## 2021-10-19 DIAGNOSIS — F900 Attention-deficit hyperactivity disorder, predominantly inattentive type: Secondary | ICD-10-CM | POA: Diagnosis not present

## 2021-11-01 DIAGNOSIS — F4010 Social phobia, unspecified: Secondary | ICD-10-CM | POA: Diagnosis not present

## 2021-11-01 DIAGNOSIS — F900 Attention-deficit hyperactivity disorder, predominantly inattentive type: Secondary | ICD-10-CM | POA: Diagnosis not present

## 2021-11-01 DIAGNOSIS — F331 Major depressive disorder, recurrent, moderate: Secondary | ICD-10-CM | POA: Diagnosis not present

## 2021-11-20 DIAGNOSIS — F4010 Social phobia, unspecified: Secondary | ICD-10-CM | POA: Diagnosis not present

## 2021-11-20 DIAGNOSIS — F331 Major depressive disorder, recurrent, moderate: Secondary | ICD-10-CM | POA: Diagnosis not present

## 2021-11-20 DIAGNOSIS — F900 Attention-deficit hyperactivity disorder, predominantly inattentive type: Secondary | ICD-10-CM | POA: Diagnosis not present

## 2021-12-06 DIAGNOSIS — F4010 Social phobia, unspecified: Secondary | ICD-10-CM | POA: Diagnosis not present

## 2021-12-06 DIAGNOSIS — F900 Attention-deficit hyperactivity disorder, predominantly inattentive type: Secondary | ICD-10-CM | POA: Diagnosis not present

## 2021-12-06 DIAGNOSIS — F331 Major depressive disorder, recurrent, moderate: Secondary | ICD-10-CM | POA: Diagnosis not present

## 2021-12-18 ENCOUNTER — Other Ambulatory Visit: Payer: Self-pay | Admitting: Internal Medicine

## 2021-12-31 DIAGNOSIS — F5105 Insomnia due to other mental disorder: Secondary | ICD-10-CM | POA: Diagnosis not present

## 2021-12-31 DIAGNOSIS — F4011 Social phobia, generalized: Secondary | ICD-10-CM | POA: Diagnosis not present

## 2021-12-31 DIAGNOSIS — F331 Major depressive disorder, recurrent, moderate: Secondary | ICD-10-CM | POA: Diagnosis not present

## 2022-02-06 DIAGNOSIS — F4010 Social phobia, unspecified: Secondary | ICD-10-CM | POA: Diagnosis not present

## 2022-02-06 DIAGNOSIS — F900 Attention-deficit hyperactivity disorder, predominantly inattentive type: Secondary | ICD-10-CM | POA: Diagnosis not present

## 2022-02-06 DIAGNOSIS — F331 Major depressive disorder, recurrent, moderate: Secondary | ICD-10-CM | POA: Diagnosis not present

## 2022-02-20 DIAGNOSIS — F331 Major depressive disorder, recurrent, moderate: Secondary | ICD-10-CM | POA: Diagnosis not present

## 2022-02-20 DIAGNOSIS — F4010 Social phobia, unspecified: Secondary | ICD-10-CM | POA: Diagnosis not present

## 2022-02-20 DIAGNOSIS — F900 Attention-deficit hyperactivity disorder, predominantly inattentive type: Secondary | ICD-10-CM | POA: Diagnosis not present

## 2022-03-21 DIAGNOSIS — F4010 Social phobia, unspecified: Secondary | ICD-10-CM | POA: Diagnosis not present

## 2022-03-21 DIAGNOSIS — F331 Major depressive disorder, recurrent, moderate: Secondary | ICD-10-CM | POA: Diagnosis not present

## 2022-03-21 DIAGNOSIS — F900 Attention-deficit hyperactivity disorder, predominantly inattentive type: Secondary | ICD-10-CM | POA: Diagnosis not present

## 2022-04-15 DIAGNOSIS — F4010 Social phobia, unspecified: Secondary | ICD-10-CM | POA: Diagnosis not present

## 2022-04-15 DIAGNOSIS — F331 Major depressive disorder, recurrent, moderate: Secondary | ICD-10-CM | POA: Diagnosis not present

## 2022-04-15 DIAGNOSIS — F900 Attention-deficit hyperactivity disorder, predominantly inattentive type: Secondary | ICD-10-CM | POA: Diagnosis not present

## 2022-04-19 DIAGNOSIS — F4011 Social phobia, generalized: Secondary | ICD-10-CM | POA: Diagnosis not present

## 2022-04-19 DIAGNOSIS — F331 Major depressive disorder, recurrent, moderate: Secondary | ICD-10-CM | POA: Diagnosis not present

## 2022-04-22 ENCOUNTER — Ambulatory Visit (INDEPENDENT_AMBULATORY_CARE_PROVIDER_SITE_OTHER): Payer: BC Managed Care – PPO | Admitting: Internal Medicine

## 2022-04-22 ENCOUNTER — Encounter: Payer: Self-pay | Admitting: Internal Medicine

## 2022-04-22 VITALS — BP 128/98 | HR 100 | Temp 98.5°F | Ht 63.0 in | Wt 314.0 lb

## 2022-04-22 DIAGNOSIS — F339 Major depressive disorder, recurrent, unspecified: Secondary | ICD-10-CM

## 2022-04-22 DIAGNOSIS — Z0001 Encounter for general adult medical examination with abnormal findings: Secondary | ICD-10-CM

## 2022-04-22 DIAGNOSIS — Z0000 Encounter for general adult medical examination without abnormal findings: Secondary | ICD-10-CM | POA: Diagnosis not present

## 2022-04-22 DIAGNOSIS — R5383 Other fatigue: Secondary | ICD-10-CM | POA: Diagnosis not present

## 2022-04-22 DIAGNOSIS — G90A Postural orthostatic tachycardia syndrome (POTS): Secondary | ICD-10-CM

## 2022-04-22 DIAGNOSIS — Z114 Encounter for screening for human immunodeficiency virus [HIV]: Secondary | ICD-10-CM | POA: Diagnosis not present

## 2022-04-22 DIAGNOSIS — D509 Iron deficiency anemia, unspecified: Secondary | ICD-10-CM | POA: Diagnosis not present

## 2022-04-22 DIAGNOSIS — Z1159 Encounter for screening for other viral diseases: Secondary | ICD-10-CM

## 2022-04-22 DIAGNOSIS — R430 Anosmia: Secondary | ICD-10-CM

## 2022-04-22 LAB — HEMOGLOBIN A1C: Hgb A1c MFr Bld: 5.1 % (ref 4.6–6.5)

## 2022-04-22 LAB — COMPREHENSIVE METABOLIC PANEL
ALT: 22 U/L (ref 0–35)
AST: 18 U/L (ref 0–37)
Albumin: 4.2 g/dL (ref 3.5–5.2)
Alkaline Phosphatase: 43 U/L (ref 39–117)
BUN: 13 mg/dL (ref 6–23)
CO2: 24 mEq/L (ref 19–32)
Calcium: 9.5 mg/dL (ref 8.4–10.5)
Chloride: 104 mEq/L (ref 96–112)
Creatinine, Ser: 0.83 mg/dL (ref 0.40–1.20)
GFR: 94.51 mL/min (ref >60.00)
Glucose, Bld: 79 mg/dL (ref 70–99)
Potassium: 4.1 mEq/L (ref 3.5–5.1)
Sodium: 138 mEq/L (ref 135–145)
Total Bilirubin: 0.4 mg/dL (ref 0.2–1.2)
Total Protein: 7 g/dL (ref 6.0–8.3)

## 2022-04-22 LAB — CBC WITH DIFFERENTIAL/PLATELET
Basophils Absolute: 0 10*3/uL (ref 0.0–0.1)
Basophils Relative: 0.3 % (ref 0.0–3.0)
Eosinophils Absolute: 0.1 10*3/uL (ref 0.0–0.7)
Eosinophils Relative: 0.7 % (ref 0.0–5.0)
HCT: 41.2 % (ref 36.0–46.0)
Hemoglobin: 13.4 g/dL (ref 12.0–15.0)
Lymphocytes Relative: 19 % (ref 12.0–46.0)
Lymphs Abs: 1.4 10*3/uL (ref 0.7–4.0)
MCHC: 32.5 g/dL (ref 30.0–36.0)
MCV: 74.6 fl — ABNORMAL LOW (ref 78.0–100.0)
Monocytes Absolute: 0.4 10*3/uL (ref 0.1–1.0)
Monocytes Relative: 5.3 % (ref 3.0–12.0)
Neutro Abs: 5.6 10*3/uL (ref 1.4–7.7)
Neutrophils Relative %: 74.7 % (ref 43.0–77.0)
Platelets: 253 10*3/uL (ref 150.0–400.0)
RBC: 5.52 Mil/uL — ABNORMAL HIGH (ref 3.87–5.11)
RDW: 17.5 % — ABNORMAL HIGH (ref 11.5–15.5)
WBC: 7.5 10*3/uL (ref 4.0–10.5)

## 2022-04-22 LAB — LDL CHOLESTEROL, DIRECT: Direct LDL: 102 mg/dL

## 2022-04-22 LAB — TSH: TSH: 3.09 u[IU]/mL (ref 0.35–5.50)

## 2022-04-22 LAB — LIPID PANEL
Cholesterol: 187 mg/dL (ref 0–200)
HDL: 57.3 mg/dL (ref >39.00)
LDL Cholesterol: 104 mg/dL — ABNORMAL HIGH (ref 0–99)
NonHDL: 129.35
Total CHOL/HDL Ratio: 3
Triglycerides: 127 mg/dL (ref 0.0–149.0)
VLDL: 25.4 mg/dL (ref 0.0–40.0)

## 2022-04-22 MED ORDER — AMLODIPINE BESYLATE 2.5 MG PO TABS: 2.5000 mg | ORAL_TABLET | Freq: Every day | ORAL | 1 refills | Status: DC

## 2022-04-22 NOTE — Assessment & Plan Note (Signed)
Seeing  Dr Nicolasa Ducking ,  Appears stable and much happier than at last visit.

## 2022-04-22 NOTE — Assessment & Plan Note (Signed)
Weight gain noted , reviewed diet and exercise regimen

## 2022-04-22 NOTE — Assessment & Plan Note (Signed)

## 2022-04-22 NOTE — Patient Instructions (Addendum)
I am Starting you on  amlodipine  2.5 mg once daily  for management of high blood pressure   Goal after one week is 130/80  I'm glad you are working on your weight!  Here are a couple of prepared meals that are low in sugar   There is an excellent premixed protein drink called "Premier Protein " THA T COMES IN > 6 FLAVORS  160 cal  30 g protein  1 g sugar 50% calcium needs     Healthy Choice low carb power bowl entrees and FITKITCHEN cauliflower pizza bowls are great low carb entrees that microwave in 5 minutes

## 2022-04-22 NOTE — Progress Notes (Signed)
Patient ID: Julie Hammond, female    DOB: 10/17/1990  Age: 32 y.o. MRN: [id number]  The patient is here for annual preventive examination and management of other chronic and acute problems.   The risk factors are reflected in the social history.   The roster of all physicians providing medical care to patient - is listed in the Snapshot section of the chart.   Activities of daily living:  The patient is 100% independent in all ADLs: dressing, toileting, feeding as well as independent mobility   Home safety : The patient has smoke detectors in the home. They wear seatbelts.  There are no unsecured firearms at home. There is no violence in the home.    There is no risks for hepatitis, STDs or HIV. There is no   history of blood transfusion. They have no travel history to infectious disease endemic areas of the world.   The patient has seen their dentist in the last six month. They have seen their eye doctor in the last year. The patinet  denies slight hearing difficulty with regard to whispered voices and some television programs.  They have deferred audiologic testing in the last year.  They do not  have excessive sun exposure. Discussed the need for sun protection: hats, long sleeves and use of sunscreen if there is significant sun exposure.    Diet: the importance of a healthy diet is discussed. They do have a healthy diet.   The benefits of regular aerobic exercise were discussed. The patient  has started an exercise program about 4 to 6 weeks ago and is averaging 25 minutes or recumbent bike   3 to 5 days per week     Depression screen: there are no signs or vegative symptoms of depression- irritability, change in appetite, anhedonia, sadness/tearfullness.   The following portions of the patient's history were reviewed and updated as appropriate: allergies, current medications, past family history, past medical history,  past surgical history, past social history  and problem list.    Visual acuity was not assessed per patient preference since the patient has regular follow up with an  ophthalmologist. Hearing and body mass index were assessed and reviewed.    During the course of the visit the patient was educated and counseled about appropriate screening and preventive services including : fall prevention , diabetes screening, nutrition counseling, colorectal cancer screening, and recommended immunizations.    Chief Complaint:  Obesity, hypertension,  PCOS with weight gain of 21 lbs since 2022 has gained 21 lb since 2022 . 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  Feels tired,  travelling a lot.  Averages 8 to 10 hours. Having some discomfort fitting into airplane seat.  This has been the incentive to lose weight.   Hypertension:  does not snore ,  no hypersomnia during the day,  no nocturia.     Review of Symptoms  Patient denies headache, fevers, malaise, unintentional weight loss, skin rash, eye pain, sinus congestion and sinus pain, sore throat, dysphagia,  hemoptysis , cough, dyspnea, wheezing, chest pain, palpitations, orthopnea, edema, abdominal pain, nausea, melena, diarrhea, constipation, flank pain, dysuria, hematuria, urinary  Frequency, nocturia, numbness, tingling, seizures,  Focal weakness, Loss of consciousness,  Tremor, insomnia, depression, anxiety, and suicidal ideation.    Physical Exam:  BP (!) 128/98   Pulse 100   Temp 98.5 F (36.9 C) (Oral)   Ht 5' 3"$  (1.6 m)  Wt (!) 314 lb (142.4 kg)   SpO2 98%   BMI 55.62 kg/m    Physical Exam  Assessment and Plan: Encounter for preventative adult health care examination  Morbid obesity ([place]) Assessment & Plan: Weight gain noted , reviewed diet and exercise regimen   Orders: -     Lipid panel -     LDL cholesterol, direct -     Comprehensive metabolic panel -     Hemoglobin A1c  Iron deficiency anemia, unspecified iron deficiency anemia  type -     CBC with Differential/Platelet  Other fatigue -     TSH  Encounter for screening for HIV -     HIV Antibody (routine testing w rflx)  Need for hepatitis C screening test -     Hepatitis C antibody  Anosmia Assessment & Plan: Normal MRI brain    POTS (postural orthostatic tachycardia syndrome) Assessment & Plan: She continues to have episdoes of exertional tachycardia but notes less frequency with improved conditioning with regular participation in exercise .    Major depressive disorder, recurrent episode with melancholic features [facility]) Assessment & Plan: Seeing  Dr Nicolasa Ducking ,  Appears stable and much happier than at last visit.    Encounter for general adult medical examination with abnormal findings Assessment & Plan: age appropriate education and counseling updated, referrals for preventative services and immunizations addressed, dietary and smoking counseling addressed, most recent labs reviewed.  I have personally reviewed and have noted:   1) the patient's medical and social history 2) The pt's use of alcohol, tobacco, and illicit drugs 3) The patient's current medications and supplements 4) Functional ability including ADL's, fall risk, home safety risk, hearing and visual impairment 5) Diet and physical activities 6) Evidence for depression or mood disorder 7) The patient's height, weight, and BMI have been recorded in the chart   I have made referrals, and provided counseling and education based on review of the above   Other orders -     amLODIPine Besylate; Take 1 tablet (2.5 mg total) by mouth daily.  Dispense: 90 tablet; Refill: 1    No follow-ups on file.  Crecencio Mc, MD

## 2022-04-22 NOTE — Assessment & Plan Note (Signed)
She continues to have episdoes of exertional tachycardia but notes less frequency with improved conditioning with regular participation in exercise .

## 2022-04-22 NOTE — Assessment & Plan Note (Signed)
Normal MRI brain.

## 2022-04-23 LAB — HEPATITIS C ANTIBODY: Hepatitis C Ab: NONREACTIVE

## 2022-04-23 LAB — HIV ANTIBODY (ROUTINE TESTING W REFLEX): HIV 1&2 Ab, 4th Generation: NONREACTIVE

## 2022-05-20 DIAGNOSIS — F900 Attention-deficit hyperactivity disorder, predominantly inattentive type: Secondary | ICD-10-CM | POA: Diagnosis not present

## 2022-05-20 DIAGNOSIS — F4010 Social phobia, unspecified: Secondary | ICD-10-CM | POA: Diagnosis not present

## 2022-07-16 DIAGNOSIS — F331 Major depressive disorder, recurrent, moderate: Secondary | ICD-10-CM | POA: Diagnosis not present

## 2022-07-24 DIAGNOSIS — F4010 Social phobia, unspecified: Secondary | ICD-10-CM | POA: Diagnosis not present

## 2022-07-24 DIAGNOSIS — F331 Major depressive disorder, recurrent, moderate: Secondary | ICD-10-CM | POA: Diagnosis not present

## 2022-07-24 DIAGNOSIS — F900 Attention-deficit hyperactivity disorder, predominantly inattentive type: Secondary | ICD-10-CM | POA: Diagnosis not present

## 2022-08-12 DIAGNOSIS — F900 Attention-deficit hyperactivity disorder, predominantly inattentive type: Secondary | ICD-10-CM | POA: Diagnosis not present

## 2022-08-12 DIAGNOSIS — F4010 Social phobia, unspecified: Secondary | ICD-10-CM | POA: Diagnosis not present

## 2022-08-12 DIAGNOSIS — F331 Major depressive disorder, recurrent, moderate: Secondary | ICD-10-CM | POA: Diagnosis not present

## 2022-08-26 DIAGNOSIS — F331 Major depressive disorder, recurrent, moderate: Secondary | ICD-10-CM | POA: Diagnosis not present

## 2022-08-26 DIAGNOSIS — F900 Attention-deficit hyperactivity disorder, predominantly inattentive type: Secondary | ICD-10-CM | POA: Diagnosis not present

## 2022-08-26 DIAGNOSIS — F4010 Social phobia, unspecified: Secondary | ICD-10-CM | POA: Diagnosis not present

## 2022-09-16 DIAGNOSIS — F4010 Social phobia, unspecified: Secondary | ICD-10-CM | POA: Diagnosis not present

## 2022-09-16 DIAGNOSIS — F331 Major depressive disorder, recurrent, moderate: Secondary | ICD-10-CM | POA: Diagnosis not present

## 2022-09-16 DIAGNOSIS — F900 Attention-deficit hyperactivity disorder, predominantly inattentive type: Secondary | ICD-10-CM | POA: Diagnosis not present

## 2022-10-01 DIAGNOSIS — F4010 Social phobia, unspecified: Secondary | ICD-10-CM | POA: Diagnosis not present

## 2022-10-01 DIAGNOSIS — F900 Attention-deficit hyperactivity disorder, predominantly inattentive type: Secondary | ICD-10-CM | POA: Diagnosis not present

## 2022-10-01 DIAGNOSIS — F331 Major depressive disorder, recurrent, moderate: Secondary | ICD-10-CM | POA: Diagnosis not present

## 2022-10-14 DIAGNOSIS — F331 Major depressive disorder, recurrent, moderate: Secondary | ICD-10-CM | POA: Diagnosis not present

## 2022-10-15 DIAGNOSIS — F331 Major depressive disorder, recurrent, moderate: Secondary | ICD-10-CM | POA: Diagnosis not present

## 2022-10-15 DIAGNOSIS — F900 Attention-deficit hyperactivity disorder, predominantly inattentive type: Secondary | ICD-10-CM | POA: Diagnosis not present

## 2022-10-15 DIAGNOSIS — F4010 Social phobia, unspecified: Secondary | ICD-10-CM | POA: Diagnosis not present

## 2022-10-20 ENCOUNTER — Other Ambulatory Visit: Payer: Self-pay | Admitting: Internal Medicine

## 2022-11-05 DIAGNOSIS — F331 Major depressive disorder, recurrent, moderate: Secondary | ICD-10-CM | POA: Diagnosis not present

## 2022-11-05 DIAGNOSIS — F4010 Social phobia, unspecified: Secondary | ICD-10-CM | POA: Diagnosis not present

## 2022-11-05 DIAGNOSIS — F900 Attention-deficit hyperactivity disorder, predominantly inattentive type: Secondary | ICD-10-CM | POA: Diagnosis not present

## 2022-11-20 DIAGNOSIS — F900 Attention-deficit hyperactivity disorder, predominantly inattentive type: Secondary | ICD-10-CM | POA: Diagnosis not present

## 2022-11-20 DIAGNOSIS — F331 Major depressive disorder, recurrent, moderate: Secondary | ICD-10-CM | POA: Diagnosis not present

## 2022-11-20 DIAGNOSIS — F4010 Social phobia, unspecified: Secondary | ICD-10-CM | POA: Diagnosis not present

## 2022-11-23 ENCOUNTER — Other Ambulatory Visit: Payer: Self-pay | Admitting: Internal Medicine

## 2022-12-10 DIAGNOSIS — F900 Attention-deficit hyperactivity disorder, predominantly inattentive type: Secondary | ICD-10-CM | POA: Diagnosis not present

## 2022-12-10 DIAGNOSIS — F4010 Social phobia, unspecified: Secondary | ICD-10-CM | POA: Diagnosis not present

## 2022-12-10 DIAGNOSIS — F331 Major depressive disorder, recurrent, moderate: Secondary | ICD-10-CM | POA: Diagnosis not present

## 2022-12-31 DIAGNOSIS — F331 Major depressive disorder, recurrent, moderate: Secondary | ICD-10-CM | POA: Diagnosis not present

## 2022-12-31 DIAGNOSIS — F4010 Social phobia, unspecified: Secondary | ICD-10-CM | POA: Diagnosis not present

## 2022-12-31 DIAGNOSIS — F900 Attention-deficit hyperactivity disorder, predominantly inattentive type: Secondary | ICD-10-CM | POA: Diagnosis not present

## 2023-01-14 DIAGNOSIS — F331 Major depressive disorder, recurrent, moderate: Secondary | ICD-10-CM | POA: Diagnosis not present

## 2023-01-22 DIAGNOSIS — F4010 Social phobia, unspecified: Secondary | ICD-10-CM | POA: Diagnosis not present

## 2023-01-22 DIAGNOSIS — F900 Attention-deficit hyperactivity disorder, predominantly inattentive type: Secondary | ICD-10-CM | POA: Diagnosis not present

## 2023-01-22 DIAGNOSIS — F331 Major depressive disorder, recurrent, moderate: Secondary | ICD-10-CM | POA: Diagnosis not present

## 2023-02-24 DIAGNOSIS — F4010 Social phobia, unspecified: Secondary | ICD-10-CM | POA: Diagnosis not present

## 2023-02-24 DIAGNOSIS — F331 Major depressive disorder, recurrent, moderate: Secondary | ICD-10-CM | POA: Diagnosis not present

## 2023-02-24 DIAGNOSIS — F900 Attention-deficit hyperactivity disorder, predominantly inattentive type: Secondary | ICD-10-CM | POA: Diagnosis not present

## 2023-03-24 DIAGNOSIS — F900 Attention-deficit hyperactivity disorder, predominantly inattentive type: Secondary | ICD-10-CM | POA: Diagnosis not present

## 2023-03-24 DIAGNOSIS — F4010 Social phobia, unspecified: Secondary | ICD-10-CM | POA: Diagnosis not present

## 2023-03-24 DIAGNOSIS — F331 Major depressive disorder, recurrent, moderate: Secondary | ICD-10-CM | POA: Diagnosis not present

## 2023-04-07 DIAGNOSIS — F330 Major depressive disorder, recurrent, mild: Secondary | ICD-10-CM | POA: Diagnosis not present

## 2023-04-07 DIAGNOSIS — F4322 Adjustment disorder with anxiety: Secondary | ICD-10-CM | POA: Diagnosis not present

## 2023-04-14 DIAGNOSIS — F331 Major depressive disorder, recurrent, moderate: Secondary | ICD-10-CM | POA: Diagnosis not present

## 2023-04-28 ENCOUNTER — Encounter: Payer: Self-pay | Admitting: Internal Medicine

## 2023-04-28 ENCOUNTER — Ambulatory Visit (INDEPENDENT_AMBULATORY_CARE_PROVIDER_SITE_OTHER): Payer: BLUE CROSS/BLUE SHIELD | Admitting: Internal Medicine

## 2023-04-28 ENCOUNTER — Other Ambulatory Visit (HOSPITAL_COMMUNITY)
Admission: RE | Admit: 2023-04-28 | Discharge: 2023-04-28 | Disposition: A | Discharge status: Home / Self Care | Payer: BLUE CROSS/BLUE SHIELD | Source: Ambulatory Visit | Attending: Internal Medicine | Admitting: Internal Medicine

## 2023-04-28 VITALS — BP 122/88 | HR 92 | Ht 63.0 in | Wt 314.4 lb

## 2023-04-28 DIAGNOSIS — R7301 Impaired fasting glucose: Secondary | ICD-10-CM | POA: Diagnosis not present

## 2023-04-28 DIAGNOSIS — I10 Essential (primary) hypertension: Secondary | ICD-10-CM | POA: Insufficient documentation

## 2023-04-28 DIAGNOSIS — Z308 Encounter for other contraceptive management: Secondary | ICD-10-CM | POA: Diagnosis not present

## 2023-04-28 DIAGNOSIS — Z124 Encounter for screening for malignant neoplasm of cervix: Secondary | ICD-10-CM | POA: Insufficient documentation

## 2023-04-28 DIAGNOSIS — Z0001 Encounter for general adult medical examination with abnormal findings: Secondary | ICD-10-CM

## 2023-04-28 DIAGNOSIS — D509 Iron deficiency anemia, unspecified: Secondary | ICD-10-CM | POA: Diagnosis not present

## 2023-04-28 DIAGNOSIS — R5383 Other fatigue: Secondary | ICD-10-CM

## 2023-04-28 DIAGNOSIS — Z113 Encounter for screening for infections with a predominantly sexual mode of transmission: Secondary | ICD-10-CM | POA: Insufficient documentation

## 2023-04-28 DIAGNOSIS — Z0000 Encounter for general adult medical examination without abnormal findings: Secondary | ICD-10-CM

## 2023-04-28 DIAGNOSIS — E282 Polycystic ovarian syndrome: Secondary | ICD-10-CM

## 2023-04-28 LAB — CBC WITH DIFFERENTIAL/PLATELET
Basophils Absolute: 0 10*3/uL (ref 0.0–0.1)
Basophils Relative: 0.3 % (ref 0.0–3.0)
Eosinophils Absolute: 0 10*3/uL (ref 0.0–0.7)
Eosinophils Relative: 0.6 % (ref 0.0–5.0)
HCT: 37.7 % (ref 36.0–46.0)
Hemoglobin: 12.1 g/dL (ref 12.0–15.0)
Lymphocytes Relative: 20.6 % (ref 12.0–46.0)
Lymphs Abs: 1.4 10*3/uL (ref 0.7–4.0)
MCHC: 32.2 g/dL (ref 30.0–36.0)
MCV: 73 fL — ABNORMAL LOW (ref 78.0–100.0)
Monocytes Absolute: 0.3 10*3/uL (ref 0.1–1.0)
Monocytes Relative: 4.8 % (ref 3.0–12.0)
Neutro Abs: 5.2 10*3/uL (ref 1.4–7.7)
Neutrophils Relative %: 73.7 % (ref 43.0–77.0)
Platelets: 318 10*3/uL (ref 150.0–400.0)
RBC: 5.16 Mil/uL — ABNORMAL HIGH (ref 3.87–5.11)
RDW: 16.7 % — ABNORMAL HIGH (ref 11.5–15.5)
WBC: 7 10*3/uL (ref 4.0–10.5)

## 2023-04-28 LAB — COMPREHENSIVE METABOLIC PANEL
ALT: 27 U/L (ref 0–35)
AST: 19 U/L (ref 0–37)
Albumin: 4 g/dL (ref 3.5–5.2)
Alkaline Phosphatase: 45 U/L (ref 39–117)
BUN: 8 mg/dL (ref 6–23)
CO2: 27 meq/L (ref 19–32)
Calcium: 9.1 mg/dL (ref 8.4–10.5)
Chloride: 104 meq/L (ref 96–112)
Creatinine, Ser: 0.75 mg/dL (ref 0.40–1.20)
GFR: 105.97 mL/min (ref >60.00)
Glucose, Bld: 91 mg/dL (ref 70–99)
Potassium: 3.9 meq/L (ref 3.5–5.1)
Sodium: 138 meq/L (ref 135–145)
Total Bilirubin: 0.4 mg/dL (ref 0.2–1.2)
Total Protein: 7.1 g/dL (ref 6.0–8.3)

## 2023-04-28 LAB — LIPID PANEL
Cholesterol: 154 mg/dL (ref 0–200)
HDL: 50.4 mg/dL (ref >39.00)
LDL Cholesterol: 88 mg/dL (ref 0–99)
NonHDL: 104.05
Total CHOL/HDL Ratio: 3
Triglycerides: 80 mg/dL (ref 0.0–149.0)
VLDL: 16 mg/dL (ref 0.0–40.0)

## 2023-04-28 LAB — HEMOGLOBIN A1C: Hgb A1c MFr Bld: 5.2 % (ref 4.6–6.5)

## 2023-04-28 LAB — TSH: TSH: 1.77 u[IU]/mL (ref 0.35–5.50)

## 2023-04-28 LAB — MICROALBUMIN / CREATININE URINE RATIO
Creatinine,U: 104.9 mg/dL
Microalb Creat Ratio: 6.7 mg/g (ref 0.0–30.0)
Microalb, Ur: <0.7 mg/dL (ref 0.0–1.9)

## 2023-04-28 LAB — LDL CHOLESTEROL, DIRECT: Direct LDL: 95 mg/dL

## 2023-04-28 NOTE — Assessment & Plan Note (Signed)

## 2023-04-28 NOTE — Patient Instructions (Addendum)
 Please continue taking amlodipine and atenolol.  Your Goal BP is 120/80 to 130/80

## 2023-04-28 NOTE — Assessment & Plan Note (Addendum)
 Elevated today without amlodipine, which she forgot to take today she reports compliance with medication regimen  but has an elevated reading today in office.  She is not using NSAIDs daily.  Discussed goal of 120/70  (130/80 for patients over 70)  to preserve renal function.  She has been asked to check her  BP  at home and  submit readings for evaluation. Renal function, electrolytes and screen for proteinuria are all normal .  Lab Results  Component Value Date   MICROALBUR <0.7 04/28/2023

## 2023-04-28 NOTE — Assessment & Plan Note (Addendum)
 Continue oral hormonal birth control.  Will consider adding spironolactone .  Lab Results  Component Value Date   HGBA1C 5.2 04/28/2023

## 2023-04-28 NOTE — Assessment & Plan Note (Signed)
 She remains iron deficient despite taking iron supplements but is not anemic  Lab Results  Component Value Date   IRON 30 06/19/2020   TIBC 546 (H) 06/19/2020   FERRITIN 8 (L) 06/19/2020     Lab Results  Component Value Date   WBC 7.0 04/28/2023   HGB 12.1 04/28/2023   HCT 37.7 04/28/2023   MCV 73.0 (L) 04/28/2023   PLT 318.0 04/28/2023

## 2023-04-28 NOTE — Progress Notes (Signed)
 Patient ID: Julie Hammond, female    DOB: 25-Feb-1991  Age: 33 y.o. MRN: [id number]  The patient is here for annual preventive examination and management of other chronic and acute problems.   The risk factors are reflected in the social history.   The roster of all physicians providing medical care to patient - is listed in the Snapshot section of the chart.   Activities of daily living:  The patient is 100% independent in all ADLs: dressing, toileting, feeding as well as independent mobility   Home safety : The patient has smoke detectors in the home. They wear seatbelts.  There are no unsecured firearms at home. There is no violence in the home.    There is no risks for hepatitis, STDs or HIV. There is no   history of blood transfusion. They have no travel history to infectious disease endemic areas of the world.   The patient has seen their dentist in the last six month. They have seen their eye doctor in the last year. The patinet  denies slight hearing difficulty with regard to whispered voices and some television programs.  They have deferred audiologic testing in the last year.  They do not  have excessive sun exposure. Discussed the need for sun protection: hats, long sleeves and use of sunscreen if there is significant sun exposure.    Diet: the importance of a healthy diet is discussed. They do have a healthy diet.   The benefits of regular aerobic exercise were discussed. The patient  exercises  3 to 5 days per week  for  60 minutes.    Depression screen: there are no signs or vegative symptoms of depression- irritability, change in appetite, anhedonia, sadness/tearfullness.   The following portions of the patient's history were reviewed and updated as appropriate: allergies, current medications, past family history, past medical history,  past surgical history, past social history  and problem list.   Visual acuity was not assessed per patient preference since the patient has  regular follow up with an  ophthalmologist. Hearing and body mass index were assessed and reviewed.    During the course of the visit the patient was educated and counseled about appropriate screening and preventive services including : fall prevention , diabetes screening, nutrition counseling, colorectal cancer screening, and recommended immunizations.    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  Some knee pain transiently due to kneeling on concrete.    2) HTN:  skipped amlodipine  this morning  . Does not own BP machine.    3) Contraception:  currently using oral contraception and Barrier method o prevent STD,  wants IUD   4) Morbid obesity:  exercises for about 20 minutes three days per week.     Review of Symptoms  Patient denies headache, fevers, malaise, unintentional weight loss, skin rash, eye pain, sinus congestion and sinus pain, sore throat, dysphagia,  hemoptysis , cough, dyspnea, wheezing, chest pain, palpitations, orthopnea, edema, abdominal pain, nausea, melena, diarrhea, constipation, flank pain, dysuria, hematuria, urinary  Frequency, nocturia, numbness, tingling, seizures,  Focal weakness, Loss of consciousness,  Tremor, insomnia, depression, anxiety, and suicidal ideation.    Physical Exam:  BP 122/88   Pulse 92   Ht 5\' 3"  (1.6 m)   Wt (!) 314 lb 6.4 oz (142.6 kg)   SpO2 97%   BMI 55.69 kg/m    Physical Exam Vitals reviewed.  Constitutional:      General:  She is not in acute distress.    Appearance: Normal appearance. She is well-developed and normal weight. She is not ill-appearing, toxic-appearing or diaphoretic.  HENT:     Head: Normocephalic.     Right Ear: Tympanic membrane, ear canal and external ear normal. There is no impacted cerumen.     Left Ear: Tympanic membrane, ear canal and external ear normal. There is no impacted cerumen.     Nose: Nose normal.     Mouth/Throat:     Mouth: Mucous  membranes are moist.     Pharynx: Oropharynx is clear.  Eyes:     General: No scleral icterus.       Right eye: No discharge.        Left eye: No discharge.     Conjunctiva/sclera: Conjunctivae normal.     Pupils: Pupils are equal, round, and reactive to light.  Neck:     Thyroid: No thyromegaly.     Vascular: No carotid bruit or JVD.  Cardiovascular:     Rate and Rhythm: Normal rate and regular rhythm.     Heart sounds: Normal heart sounds.  Pulmonary:     Effort: Pulmonary effort is normal. No respiratory distress.     Breath sounds: Normal breath sounds.  Chest:  Breasts:    Breasts are symmetrical.     Right: Normal. No swelling, inverted nipple, mass, nipple discharge, skin change or tenderness.     Left: Normal. No swelling, inverted nipple, mass, nipple discharge, skin change or tenderness.  Abdominal:     General: Bowel sounds are normal.     Palpations: Abdomen is soft. There is no mass.     Tenderness: There is no abdominal tenderness. There is no guarding or rebound.     Hernia: There is no hernia in the left inguinal area or right inguinal area.  Genitourinary:    General: Normal vulva.     Exam position: Lithotomy position.     Labia:        Right: No rash or lesion.        Left: No rash or lesion.      Vagina: Normal.     Cervix: Normal.     Uterus: Normal.      Adnexa: Right adnexa normal and left adnexa normal.  Musculoskeletal:        General: Normal range of motion.     Cervical back: Normal range of motion and neck supple.  Lymphadenopathy:     Cervical: No cervical adenopathy.     Upper Body:     Right upper body: No supraclavicular, axillary or pectoral adenopathy.     Left upper body: No supraclavicular, axillary or pectoral adenopathy.  Skin:    General: Skin is warm and dry.  Neurological:     General: No focal deficit present.     Mental Status: She is alert and oriented to person, place, and time. Mental status is at baseline.  Psychiatric:         Mood and Affect: Mood normal.        Behavior: Behavior normal.        Thought Content: Thought content normal.        Judgment: Judgment normal.    Assessment and Plan: Cervical cancer screening -     Cytology - PAP  Encounter for preventative adult health care examination  Iron deficiency anemia, unspecified iron deficiency anemia type Assessment & Plan: She remains iron deficient despite taking iron supplements but  is not anemic  Lab Results  Component Value Date   IRON 30 06/19/2020   TIBC 546 (H) 06/19/2020   FERRITIN 8 (L) 06/19/2020     Lab Results  Component Value Date   WBC 7.0 04/28/2023   HGB 12.1 04/28/2023   HCT 37.7 04/28/2023   MCV 73.0 (L) 04/28/2023   PLT 318.0 04/28/2023     Orders: -     CBC with Differential/Platelet  Morbid obesity (HCC) Assessment & Plan:  have addressed  BMI and recommended wt loss of 10% of body weight over the next 6 months using a low glycemic index diet and regular participation in aerobic exercise a minimum of 30 minutes, a  minimum of 5 days per week.     Encounter for other contraceptive management -     Microalbumin / creatinine urine ratio -     Ambulatory referral to Obstetrics / Gynecology  Essential hypertension Assessment & Plan: Elevated today without amlodipine, which she forgot to take today she reports compliance with medication regimen  but has an elevated reading today in office.  She is not using NSAIDs daily.  Discussed goal of 120/70  (130/80 for patients over 70)  to preserve renal function.  She has been asked to check her  BP  at home and  submit readings for evaluation. Renal function, electrolytes and screen for proteinuria are all normal .  Lab Results  Component Value Date   MICROALBUR <0.7 04/28/2023       Orders: -     Lipid panel -     LDL cholesterol, direct -     Comprehensive metabolic panel -     Microalbumin / creatinine urine ratio  Impaired fasting glucose -      Hemoglobin A1c  Other fatigue -     TSH  Encounter for general adult medical examination with abnormal findings Assessment & Plan: age appropriate education and counseling updated, referrals for preventative services and immunizations addressed, dietary and smoking counseling addressed, most recent labs reviewed.  I have personally reviewed and have noted:   1) the patient's medical and social history 2) The pt's use of alcohol, tobacco, and illicit drugs 3) The patient's current medications and supplements 4) Functional ability including ADL's, fall risk, home safety risk, hearing and visual impairment 5) Diet and physical activities 6) Evidence for depression or mood disorder 7) The patient's height, weight, and BMI have been recorded in the chartrhythm.     I have made referrals, and provided counseling and education based on review of the above    Polycystic ovarian syndrome Assessment & Plan: Continue oral hormonal birth control.  Will consider adding spironolactone .  Lab Results  Component Value Date   HGBA1C 5.2 04/28/2023        Return in about 6 months (around 10/26/2023) for hypertension.  Sherlene Shams, MD

## 2023-04-28 NOTE — Assessment & Plan Note (Signed)
have addressed  BMI and recommended wt loss of 10% of body weight over the next 6 months using a low glycemic index diet and regular participation in aerobic exercise a minimum of 30 minutes, a  minimum of 5 days per week.

## 2023-04-29 ENCOUNTER — Encounter: Payer: Self-pay | Admitting: Internal Medicine

## 2023-04-29 DIAGNOSIS — F900 Attention-deficit hyperactivity disorder, predominantly inattentive type: Secondary | ICD-10-CM | POA: Diagnosis not present

## 2023-04-29 DIAGNOSIS — F330 Major depressive disorder, recurrent, mild: Secondary | ICD-10-CM | POA: Diagnosis not present

## 2023-04-29 DIAGNOSIS — F4322 Adjustment disorder with anxiety: Secondary | ICD-10-CM | POA: Diagnosis not present

## 2023-05-07 LAB — CYTOLOGY - PAP
Chlamydia: NEGATIVE
Comment: NEGATIVE
Comment: NEGATIVE
Comment: NEGATIVE
Comment: NEGATIVE
Comment: NORMAL
Diagnosis: NEGATIVE
HSV1: NEGATIVE
HSV2: NEGATIVE
High risk HPV: NEGATIVE
Neisseria Gonorrhea: NEGATIVE
Trichomonas: NEGATIVE

## 2023-05-09 ENCOUNTER — Encounter: Payer: Self-pay | Admitting: Obstetrics and Gynecology

## 2023-05-28 DIAGNOSIS — F900 Attention-deficit hyperactivity disorder, predominantly inattentive type: Secondary | ICD-10-CM | POA: Diagnosis not present

## 2023-05-28 DIAGNOSIS — F4322 Adjustment disorder with anxiety: Secondary | ICD-10-CM | POA: Diagnosis not present

## 2023-05-28 DIAGNOSIS — F330 Major depressive disorder, recurrent, mild: Secondary | ICD-10-CM | POA: Diagnosis not present

## 2023-06-10 ENCOUNTER — Telehealth: Payer: Self-pay | Admitting: Internal Medicine

## 2023-06-10 NOTE — Telephone Encounter (Signed)
 Please call the office or reschedule your appointment through MyChart. Dr Darrick Huntsman will not be in the office on 10/27/2023.  E2C2 please reschdule

## 2023-06-17 DIAGNOSIS — F4322 Adjustment disorder with anxiety: Secondary | ICD-10-CM | POA: Diagnosis not present

## 2023-06-17 DIAGNOSIS — F330 Major depressive disorder, recurrent, mild: Secondary | ICD-10-CM | POA: Diagnosis not present

## 2023-06-17 DIAGNOSIS — F900 Attention-deficit hyperactivity disorder, predominantly inattentive type: Secondary | ICD-10-CM | POA: Diagnosis not present

## 2023-07-08 DIAGNOSIS — F331 Major depressive disorder, recurrent, moderate: Secondary | ICD-10-CM | POA: Diagnosis not present

## 2023-07-28 DIAGNOSIS — F900 Attention-deficit hyperactivity disorder, predominantly inattentive type: Secondary | ICD-10-CM | POA: Diagnosis not present

## 2023-07-28 DIAGNOSIS — F4322 Adjustment disorder with anxiety: Secondary | ICD-10-CM | POA: Diagnosis not present

## 2023-07-28 DIAGNOSIS — F330 Major depressive disorder, recurrent, mild: Secondary | ICD-10-CM | POA: Diagnosis not present

## 2023-08-18 DIAGNOSIS — F900 Attention-deficit hyperactivity disorder, predominantly inattentive type: Secondary | ICD-10-CM | POA: Diagnosis not present

## 2023-08-18 DIAGNOSIS — F330 Major depressive disorder, recurrent, mild: Secondary | ICD-10-CM | POA: Diagnosis not present

## 2023-08-18 DIAGNOSIS — F4322 Adjustment disorder with anxiety: Secondary | ICD-10-CM | POA: Diagnosis not present

## 2023-09-15 DIAGNOSIS — F4322 Adjustment disorder with anxiety: Secondary | ICD-10-CM | POA: Diagnosis not present

## 2023-09-15 DIAGNOSIS — F900 Attention-deficit hyperactivity disorder, predominantly inattentive type: Secondary | ICD-10-CM | POA: Diagnosis not present

## 2023-09-15 DIAGNOSIS — F330 Major depressive disorder, recurrent, mild: Secondary | ICD-10-CM | POA: Diagnosis not present

## 2023-10-08 DIAGNOSIS — F900 Attention-deficit hyperactivity disorder, predominantly inattentive type: Secondary | ICD-10-CM | POA: Diagnosis not present

## 2023-10-08 DIAGNOSIS — F4322 Adjustment disorder with anxiety: Secondary | ICD-10-CM | POA: Diagnosis not present

## 2023-10-13 DIAGNOSIS — F4011 Social phobia, generalized: Secondary | ICD-10-CM | POA: Diagnosis not present

## 2023-10-13 DIAGNOSIS — F331 Major depressive disorder, recurrent, moderate: Secondary | ICD-10-CM | POA: Diagnosis not present

## 2023-10-13 DIAGNOSIS — F5105 Insomnia due to other mental disorder: Secondary | ICD-10-CM | POA: Diagnosis not present

## 2023-10-27 ENCOUNTER — Ambulatory Visit: Payer: BLUE CROSS/BLUE SHIELD | Admitting: Internal Medicine

## 2023-11-17 DIAGNOSIS — F4322 Adjustment disorder with anxiety: Secondary | ICD-10-CM | POA: Diagnosis not present

## 2023-11-17 DIAGNOSIS — F900 Attention-deficit hyperactivity disorder, predominantly inattentive type: Secondary | ICD-10-CM | POA: Diagnosis not present

## 2023-11-17 DIAGNOSIS — F330 Major depressive disorder, recurrent, mild: Secondary | ICD-10-CM | POA: Diagnosis not present

## 2023-12-01 DIAGNOSIS — F4322 Adjustment disorder with anxiety: Secondary | ICD-10-CM | POA: Diagnosis not present

## 2023-12-01 DIAGNOSIS — F330 Major depressive disorder, recurrent, mild: Secondary | ICD-10-CM | POA: Diagnosis not present

## 2023-12-01 DIAGNOSIS — F900 Attention-deficit hyperactivity disorder, predominantly inattentive type: Secondary | ICD-10-CM | POA: Diagnosis not present

## 2023-12-23 DIAGNOSIS — F330 Major depressive disorder, recurrent, mild: Secondary | ICD-10-CM | POA: Diagnosis not present

## 2023-12-23 DIAGNOSIS — F4322 Adjustment disorder with anxiety: Secondary | ICD-10-CM | POA: Diagnosis not present

## 2023-12-23 DIAGNOSIS — F900 Attention-deficit hyperactivity disorder, predominantly inattentive type: Secondary | ICD-10-CM | POA: Diagnosis not present

## 2024-01-06 ENCOUNTER — Ambulatory Visit
Admission: EM | Admit: 2024-01-06 | Discharge: 2024-01-06 | Disposition: A | Discharge status: Home / Self Care | Attending: Emergency Medicine | Admitting: Emergency Medicine

## 2024-01-06 DIAGNOSIS — B349 Viral infection, unspecified: Secondary | ICD-10-CM

## 2024-01-06 LAB — POC COVID19/FLU A&B COMBO
Covid Antigen, POC: NEGATIVE
Influenza A Antigen, POC: NEGATIVE
Influenza B Antigen, POC: NEGATIVE

## 2024-01-06 LAB — POCT RAPID STREP A (OFFICE): Rapid Strep A Screen: NEGATIVE

## 2024-01-06 NOTE — Discharge Instructions (Addendum)
 The strep, COVID and flu tests are negative.   Take Tylenol or ibuprofen as needed for fever or discomfort.  Take plain Mucinex as needed for congestion.  Rest and keep yourself hydrated.    Follow-up with your primary care provider if your symptoms are not improving.

## 2024-01-06 NOTE — ED Triage Notes (Signed)
 Patient to Urgent Care with complaints of fevers (max 101.6) / body aches/ sore throat/ ear pain/  nasal congestion.   Symptoms started Saturday.   Alternating ibuprofen  and tylenol.

## 2024-01-06 NOTE — ED Provider Notes (Signed)
 Julie Hammond    CSN: [id number] Arrival date & time: 01/06/24  9176      History   Chief Complaint Chief Complaint  Patient presents with   Fever   Sore Throat    HPI Julie Hammond is a 33 y.o. female.  Patient presents with 2-day history of fever, body aches, earache, sore throat, congestion, runny nose, cough.  Tmax 101.6.  Treating with Tylenol and ibuprofen .  No shortness of breath, vomiting, diarrhea.  The history is provided by the patient and medical records.    Past Medical History:  Diagnosis Date   Allergy    Anxiety    Chicken pox    Depression    Hypertension    POTS (postural orthostatic tachycardia syndrome)    Syncope and collapse     Patient Active Problem List   Diagnosis Date Noted   Essential hypertension 04/28/2023   Cervical cancer screening 10/06/2020   Anosmia 01/25/2020   Encounter for general adult medical examination with abnormal findings 01/25/2020   Major depressive disorder, recurrent episode with melancholic features 10/07/2018   Family history of long QT syndrome 10/07/2018   Back pain 04/09/2018   Toe pain, left 10/07/2017   Iron  deficiency anemia 10/07/2017   Tinnitus, subjective 01/30/2014   Vitamin D  deficiency 10/23/2013   POTS (postural orthostatic tachycardia syndrome) 08/27/2012   Family history of cardiac disorder 08/27/2012   Syncope 08/27/2012   Plantar warts 07/12/2012   Morbid obesity (HCC) 11/27/2011   Polycystic ovarian syndrome 11/27/2011   Anxiety with obsessional features 11/27/2011    Past Surgical History:  Procedure Laterality Date   NO PAST SURGERIES      OB History     Gravida  0   Para  0   Term  0   Preterm  0   AB  0   Living  0      SAB  0   IAB  0   Ectopic  0   Multiple  0   Live Births  0            Home Medications    Prior to Admission medications   Medication Sig Start Date End Date Taking? Authorizing Provider  amLODipine  (NORVASC ) 2.5 MG  tablet TAKE 1 TABLET BY MOUTH EVERY DAY 10/21/22   Marylynn Verneita CROME, MD  atenolol  (TENORMIN ) 50 MG tablet TAKE 1 TABLET BY MOUTH TWICE A DAY 09/05/21   Marylynn Verneita CROME, MD  buPROPion  (WELLBUTRIN  XL) 300 MG 24 hr tablet Take 300 mg by mouth daily. 11/09/19   [provider]  HAILEY FE 1.5/30 1.5-30 MG-MCG tablet TAKE 1 TABLET BY MOUTH EVERY DAY 11/26/22   Marylynn Verneita CROME, MD  sertraline  (ZOLOFT ) 100 MG tablet Take by mouth. 12/10/19   [provider]    Family History Family History  Problem Relation Age of Onset   Obesity Mother    Obesity Father    Breast cancer Neg Hx    Ovarian cancer Neg Hx    Colon cancer Neg Hx    Heart disease Neg Hx     Social History Social History   Tobacco Use   Smoking status: Never   Smokeless tobacco: Never  Vaping Use   Vaping status: Never Used  Substance Use Topics   Alcohol use: Yes    Comment: occasional    Drug use: No     Allergies   Patient has no known allergies.   Review of Systems Review  of Systems  Constitutional:  Positive for fever. Negative for chills.  HENT:  Positive for ear pain, postnasal drip, rhinorrhea and sore throat.   Respiratory:  Positive for cough. Negative for shortness of breath.   Gastrointestinal:  Negative for diarrhea and vomiting.     Physical Exam Triage Vital Signs ED Triage Vitals  Encounter Vitals Group     BP 01/06/24 0901 135/79     Girls Systolic BP Percentile --      Girls Diastolic BP Percentile --      Boys Systolic BP Percentile --      Boys Diastolic BP Percentile --      Pulse Rate 01/06/24 0901 (!) 108     Resp 01/06/24 0901 18     Temp 01/06/24 0901 98.1 F (36.7 C)     Temp src --      SpO2 01/06/24 0901 99 %     Weight --      Height --      Head Circumference --      Peak Flow --      Pain Score 01/06/24 0858 6     Pain Loc --      Pain Education --      Exclude from Growth Chart --    No data found.  Updated Vital Signs BP 135/79   Pulse (!) 108    Temp 98.1 F (36.7 C)   Resp 18   LMP 12/19/2023   SpO2 99%   Visual Acuity Right Eye Distance:   Left Eye Distance:   Bilateral Distance:    Right Eye Near:   Left Eye Near:    Bilateral Near:     Physical Exam Constitutional:      General: She is not in acute distress. HENT:     Right Ear: Tympanic membrane normal.     Left Ear: Tympanic membrane normal.     Nose: Rhinorrhea present.     Mouth/Throat:     Mouth: Mucous membranes are moist.     Pharynx: Oropharynx is clear.  Cardiovascular:     Rate and Rhythm: Normal rate and regular rhythm.     Heart sounds: Normal heart sounds.  Pulmonary:     Effort: Pulmonary effort is normal. No respiratory distress.     Breath sounds: Normal breath sounds.  Neurological:     Mental Status: She is alert.      UC Treatments / Results  Labs (all labs ordered are listed, but only abnormal results are displayed) Labs Reviewed  POC COVID19/FLU A&B COMBO  POCT RAPID STREP A (OFFICE)    EKG   Radiology No results found.  Procedures Procedures (including critical care time)  Medications Ordered in UC Medications - No data to display  Initial Impression / Assessment and Plan / UC Course  I have reviewed the triage vital signs and the nursing notes.  Pertinent labs & imaging results that were available during my care of the patient were reviewed by me and considered in my medical decision making (see chart for details).    Viral illness.  Rapid strep, COVID and flu negative.  Discussed symptomatic treatment including Tylenol or ibuprofen  as needed for fever or discomfort, plain Mucinex as needed for congestion, rest, hydration.  Instructed patient to follow-up with her PCP if not improving.  ED precautions given.  Patient agrees to plan of care.   Final Clinical Impressions(s) / UC Diagnoses   Final diagnoses:  Viral illness  Discharge Instructions      The strep, COVID and flu tests are negative.   Take  Tylenol or ibuprofen  as needed for fever or discomfort.  Take plain Mucinex as needed for congestion.  Rest and keep yourself hydrated.    Follow-up with your primary care provider if your symptoms are not improving.         ED Prescriptions   None    PDMP not reviewed this encounter.   Corlis Burnard DEL, NP 01/06/24 0930

## 2024-01-08 DIAGNOSIS — F331 Major depressive disorder, recurrent, moderate: Secondary | ICD-10-CM | POA: Diagnosis not present

## 2024-01-12 DIAGNOSIS — F330 Major depressive disorder, recurrent, mild: Secondary | ICD-10-CM | POA: Diagnosis not present

## 2024-01-12 DIAGNOSIS — F4322 Adjustment disorder with anxiety: Secondary | ICD-10-CM | POA: Diagnosis not present

## 2024-01-12 DIAGNOSIS — F900 Attention-deficit hyperactivity disorder, predominantly inattentive type: Secondary | ICD-10-CM | POA: Diagnosis not present

## 2024-01-26 DIAGNOSIS — F900 Attention-deficit hyperactivity disorder, predominantly inattentive type: Secondary | ICD-10-CM | POA: Diagnosis not present

## 2024-01-26 DIAGNOSIS — F330 Major depressive disorder, recurrent, mild: Secondary | ICD-10-CM | POA: Diagnosis not present

## 2024-01-26 DIAGNOSIS — F4322 Adjustment disorder with anxiety: Secondary | ICD-10-CM | POA: Diagnosis not present

## 2024-02-17 DIAGNOSIS — F330 Major depressive disorder, recurrent, mild: Secondary | ICD-10-CM | POA: Diagnosis not present

## 2024-02-17 DIAGNOSIS — F900 Attention-deficit hyperactivity disorder, predominantly inattentive type: Secondary | ICD-10-CM | POA: Diagnosis not present

## 2024-02-17 DIAGNOSIS — F4322 Adjustment disorder with anxiety: Secondary | ICD-10-CM | POA: Diagnosis not present

## 2024-03-02 DIAGNOSIS — F900 Attention-deficit hyperactivity disorder, predominantly inattentive type: Secondary | ICD-10-CM | POA: Diagnosis not present

## 2024-03-02 DIAGNOSIS — F330 Major depressive disorder, recurrent, mild: Secondary | ICD-10-CM | POA: Diagnosis not present

## 2024-03-02 DIAGNOSIS — F4322 Adjustment disorder with anxiety: Secondary | ICD-10-CM | POA: Diagnosis not present

## 2024-04-28 ENCOUNTER — Encounter: Admitting: Internal Medicine
